# Patient Record
Sex: Male | Born: 1990
Health system: Southern US, Community
[De-identification: ages and names within clinical notes are randomized; demographics above are authoritative.]

## PROBLEM LIST (undated history)

## (undated) DIAGNOSIS — F1290 Cannabis use, unspecified, uncomplicated: Secondary | ICD-10-CM

## (undated) DIAGNOSIS — B20 Human immunodeficiency virus [HIV] disease: Secondary | ICD-10-CM

## (undated) DIAGNOSIS — Z21 Asymptomatic human immunodeficiency virus [HIV] infection status: Secondary | ICD-10-CM

## (undated) HISTORY — DX: Cannabis use, unspecified, uncomplicated: F12.90

## (undated) HISTORY — PX: OTHER SURGICAL HISTORY: SHX169

---
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
## 2008-04-26 ENCOUNTER — Emergency Department (HOSPITAL_COMMUNITY): Admission: EM | Admit: 2008-04-26 | Discharge: 2008-04-26 | Payer: Self-pay | Admitting: Internal Medicine

## 2008-09-24 ENCOUNTER — Emergency Department (HOSPITAL_COMMUNITY): Admission: EM | Admit: 2008-09-24 | Discharge: 2008-09-24 | Payer: Self-pay | Admitting: Emergency Medicine

## 2008-09-26 ENCOUNTER — Emergency Department (HOSPITAL_COMMUNITY): Admission: EM | Admit: 2008-09-26 | Discharge: 2008-09-26 | Payer: Self-pay | Admitting: Emergency Medicine

## 2009-04-01 ENCOUNTER — Emergency Department: Payer: Self-pay | Admitting: Emergency Medicine

## 2010-05-19 LAB — COMPREHENSIVE METABOLIC PANEL
ALT: 11 U/L (ref 0–53)
AST: 17 U/L (ref 0–37)
Albumin: 3.9 g/dL (ref 3.5–5.2)
Alkaline Phosphatase: 67 U/L (ref 39–117)
BUN: 8 mg/dL (ref 6–23)
CO2: 28 mEq/L (ref 19–32)
Calcium: 9.2 mg/dL (ref 8.4–10.5)
Chloride: 101 mEq/L (ref 96–112)
Creatinine, Ser: 0.87 mg/dL (ref 0.4–1.5)
GFR calc Af Amer: >60 mL/min (ref >60)
GFR calc non Af Amer: >60 mL/min (ref >60)
Glucose, Bld: 98 mg/dL (ref 70–99)
Potassium: 3.7 mEq/L (ref 3.5–5.1)
Sodium: 135 mEq/L (ref 135–145)
Total Bilirubin: 0.4 mg/dL (ref 0.3–1.2)
Total Protein: 6.8 g/dL (ref 6.0–8.3)

## 2010-05-19 LAB — CBC
HCT: 45.6 % (ref 39.0–52.0)
Hemoglobin: 15.4 g/dL (ref 13.0–17.0)
MCHC: 33.9 g/dL (ref 30.0–36.0)
MCV: 89.6 fL (ref 78.0–100.0)
Platelets: 270 10*3/uL (ref 150–400)
RBC: 5.1 MIL/uL (ref 4.22–5.81)
RDW: 13.7 % (ref 11.5–15.5)
WBC: 10 10*3/uL (ref 4.0–10.5)

## 2010-05-19 LAB — URINALYSIS, ROUTINE W REFLEX MICROSCOPIC
Bilirubin Urine: NEGATIVE
Glucose, UA: NEGATIVE mg/dL
Hgb urine dipstick: NEGATIVE
Ketones, ur: NEGATIVE mg/dL
Nitrite: NEGATIVE
Protein, ur: NEGATIVE mg/dL
Specific Gravity, Urine: 1.026 (ref 1.005–1.030)
Urobilinogen, UA: 1 mg/dL (ref 0.0–1.0)
pH: 6 (ref 5.0–8.0)

## 2010-05-19 LAB — DIFFERENTIAL
Basophils Absolute: 0.1 10*3/uL (ref 0.0–0.1)
Basophils Relative: 1 % (ref 0–1)
Eosinophils Absolute: 0.1 10*3/uL (ref 0.0–0.7)
Eosinophils Relative: 1 % (ref 0–5)
Lymphocytes Relative: 26 % (ref 12–46)
Lymphs Abs: 2.6 10*3/uL (ref 0.7–4.0)
Monocytes Absolute: 0.9 10*3/uL (ref 0.1–1.0)
Monocytes Relative: 9 % (ref 3–12)
Neutro Abs: 6.2 10*3/uL (ref 1.7–7.7)
Neutrophils Relative %: 62 % (ref 43–77)

## 2010-05-19 LAB — LIPASE, BLOOD: Lipase: 16 U/L (ref 11–59)

## 2010-07-02 ENCOUNTER — Emergency Department: Payer: Self-pay | Admitting: Emergency Medicine

## 2010-09-21 DIAGNOSIS — B20 Human immunodeficiency virus [HIV] disease: Secondary | ICD-10-CM | POA: Insufficient documentation

## 2010-10-02 ENCOUNTER — Ambulatory Visit: Payer: Self-pay

## 2010-12-31 DIAGNOSIS — Z720 Tobacco use: Secondary | ICD-10-CM | POA: Insufficient documentation

## 2010-12-31 DIAGNOSIS — F17200 Nicotine dependence, unspecified, uncomplicated: Secondary | ICD-10-CM | POA: Insufficient documentation

## 2011-03-25 DIAGNOSIS — K6282 Dysplasia of anus: Secondary | ICD-10-CM | POA: Insufficient documentation

## 2011-04-24 ENCOUNTER — Encounter: Payer: Self-pay | Admitting: *Deleted

## 2011-04-24 NOTE — Progress Notes (Signed)
Referral for this patient made and no contact was ever established. (see Tammy's file) Will forward the file to [facility] for follow up.

## 2011-04-26 ENCOUNTER — Encounter: Payer: Self-pay | Admitting: *Deleted

## 2011-04-26 NOTE — Progress Notes (Signed)
This patient has moved out of our service area and receives his care at [facility].

## 2011-07-20 DIAGNOSIS — Z1589 Genetic susceptibility to other disease: Secondary | ICD-10-CM | POA: Insufficient documentation

## 2012-12-17 ENCOUNTER — Emergency Department (HOSPITAL_COMMUNITY)
Admission: EM | Admit: 2012-12-17 | Discharge: 2012-12-17 | Disposition: A | Discharge status: Home / Self Care | Payer: Self-pay | Attending: Emergency Medicine | Admitting: Emergency Medicine

## 2012-12-17 ENCOUNTER — Encounter (HOSPITAL_COMMUNITY): Payer: Self-pay | Admitting: Emergency Medicine

## 2012-12-17 DIAGNOSIS — R112 Nausea with vomiting, unspecified: Secondary | ICD-10-CM | POA: Insufficient documentation

## 2012-12-17 DIAGNOSIS — R197 Diarrhea, unspecified: Secondary | ICD-10-CM | POA: Insufficient documentation

## 2012-12-17 DIAGNOSIS — Z79899 Other long term (current) drug therapy: Secondary | ICD-10-CM | POA: Insufficient documentation

## 2012-12-17 DIAGNOSIS — Z21 Asymptomatic human immunodeficiency virus [HIV] infection status: Secondary | ICD-10-CM | POA: Insufficient documentation

## 2012-12-17 DIAGNOSIS — F17200 Nicotine dependence, unspecified, uncomplicated: Secondary | ICD-10-CM | POA: Insufficient documentation

## 2012-12-17 HISTORY — DX: Asymptomatic human immunodeficiency virus (hiv) infection status: Z21

## 2012-12-17 HISTORY — DX: Human immunodeficiency virus (HIV) disease: B20

## 2012-12-17 LAB — CBC WITH DIFFERENTIAL/PLATELET
Basophils Absolute: 0.1 10*3/uL (ref 0.0–0.1)
Lymphocytes Relative: 15 % (ref 12–46)
Monocytes Relative: 15 % — ABNORMAL HIGH (ref 3–12)
Neutrophils Relative %: 69 % (ref 43–77)
Platelets: 204 10*3/uL (ref 150–400)
RDW: 13.5 % (ref 11.5–15.5)
WBC: 9.2 10*3/uL (ref 4.0–10.5)

## 2012-12-17 LAB — COMPREHENSIVE METABOLIC PANEL
Alkaline Phosphatase: 97 U/L (ref 39–117)
BUN: 14 mg/dL (ref 6–23)
Chloride: 97 mEq/L (ref 96–112)
GFR calc Af Amer: >90 mL/min (ref >90)
Glucose, Bld: 99 mg/dL (ref 70–99)
Potassium: 3.7 mEq/L (ref 3.5–5.1)
Total Bilirubin: 0.2 mg/dL — ABNORMAL LOW (ref 0.3–1.2)
Total Protein: 7.3 g/dL (ref 6.0–8.3)

## 2012-12-17 MED ORDER — MORPHINE SULFATE 4 MG/ML IJ SOLN
4.0000 mg | Freq: Once | INTRAMUSCULAR | Status: AC
  Administered 2012-12-17: 4 mg via INTRAVENOUS
  Filled 2012-12-17: qty 1

## 2012-12-17 MED ORDER — SODIUM CHLORIDE 0.9 % IV BOLUS (SEPSIS)
2000.0000 mL | Freq: Once | INTRAVENOUS | Status: AC
  Administered 2012-12-17: 2000 mL via INTRAVENOUS

## 2012-12-17 MED ORDER — ONDANSETRON HCL 4 MG/2ML IJ SOLN
4.0000 mg | Freq: Once | INTRAMUSCULAR | Status: AC
  Administered 2012-12-17: 4 mg via INTRAVENOUS
  Filled 2012-12-17: qty 2

## 2012-12-17 MED ORDER — ONDANSETRON HCL 8 MG PO TABS: 8.0000 mg | ORAL_TABLET | Freq: Three times a day (TID) | ORAL | Status: DC | PRN

## 2012-12-17 NOTE — ED Notes (Signed)
Pt presents with mid umbilical abdominal pain and n/v x 2 days. Pain worsening today. Pt is ashen in color.  And states does not feel well. Last normal BM was this morning.  vomiting with food only can keep liquids down per pt.

## 2012-12-17 NOTE — ED Provider Notes (Signed)
CSN: [id number]     Arrival date & time 12/17/12  1135 History  This chart was scribed for Doug Sou, MD,  by Ashley Jacobs, ED Scribe. The patient was seen in room APA09/APA09 and the patient's care was started at 2:31 PM   First MD Initiated Contact with Patient 12/17/12 1425     Chief Complaint  Patient presents with  . Abdominal Pain   (Consider location/radiation/quality/duration/timing/severity/associated sxs/prior Treatment) The history is provided by the patient and medical records. No language interpreter was used.   HPI Comments: Scott Butler is a 22 y.o. male who presents to the Emergency Department complaining of intermittent, moderate, upper abdominal pain that presented 3 days ago. He states he ate something and the pain returned yesterday. Pt reports the associated symptoms of vomiting ( most recent episode was last night), diarrhea, and decreased appetite (his last meal of a couple of sandwiches was last night). He explains that the pain is worse mostly with drinking and eating. The pain is relieved with laying on his stomach. His last BM was just before arrival to the ED, diarrhea, no blood. Able to drink water without pain Pt denies fever and  blood in stool. His last CD-4 count was in the 800's 2 months ago.  Pt has a medical hx of HIV. Pt does not have any known allergies. His past surgeries consists of anal wart removal. He is a current smoker and drinks alcohol. No treatment prior to coming here. Pain mild to moderate at present. Past Medical History  Diagnosis Date  . HIV (human immunodeficiency virus infection)    Past Surgical History  Procedure Laterality Date  . Removal of anal warts     History reviewed. No pertinent family history. History  Substance Use Topics  . Smoking status: Current Every Day Smoker  . Smokeless tobacco: Not on file  . Alcohol Use: No    Review of Systems  Constitutional: Positive for appetite change. Negative for fatigue.   HENT: Negative.   Respiratory: Negative.   Cardiovascular: Negative.   Gastrointestinal: Positive for nausea, vomiting, abdominal pain and diarrhea. Negative for blood in stool.  Musculoskeletal: Negative.   Skin: Negative.   Neurological: Negative.   Psychiatric/Behavioral: Negative.   All other systems reviewed and are negative.    Allergies  Review of patient's allergies indicates no known allergies.  Home Medications   Current Outpatient Rx  Name  Route  Sig  Dispense  Refill  . efavirenz (SUSTIVA) 600 MG tablet   Oral   Take 600 mg by mouth at bedtime.         Marland Kitchen emtricitabine-tenofovir (TRUVADA) 200-300 MG per tablet   Oral   Take 1 tablet by mouth daily.          BP 106/53  Pulse 77  Temp(Src) 98.2 F (36.8 C) (Oral)  Resp 14  Ht 5\' 7"  (1.702 m)  Wt 135 lb (61.236 kg)  BMI 21.14 kg/m2  SpO2 98% Physical Exam  Nursing note and vitals reviewed. Constitutional: He is oriented to person, place, and time. He appears well-developed and well-nourished. No distress.  HENT:  Head: Normocephalic.  Mucous membranes dry  Eyes: Conjunctivae are normal. Pupils are equal, round, and reactive to light.  Neck: Neck supple. No thyromegaly present.  Cardiovascular: Normal rate, regular rhythm and normal heart sounds.   Pulmonary/Chest: Effort normal and breath sounds normal.  Abdominal: Soft. Bowel sounds are normal. There is tenderness.  Tender at epigastrium  Genitourinary: Penis  normal.  Normal circumcised male  Musculoskeletal: Normal range of motion. He exhibits no edema and no tenderness.  Neurological: He is alert and oriented to person, place, and time. No cranial nerve deficit.  Skin: Skin is warm.  Psychiatric: His behavior is normal.    ED Course  Procedures (including critical care time) DIAGNOSTIC STUDIES: Oxygen Saturation is 98% on room air, normal by my interpretation.    COORDINATION OF CARE: 2:36 PM Discussed course of care with pt . Pt  understands and agrees.  Labs Review Labs Reviewed  COMPREHENSIVE METABOLIC PANEL - Abnormal; Notable for the following:    Sodium 133 (*)    AST 59 (*)    Total Bilirubin 0.2 (*)    All other components within normal limits  CBC WITH DIFFERENTIAL - Abnormal; Notable for the following:    Monocytes Relative 15 (*)    Monocytes Absolute 1.4 (*)    All other components within normal limits   Imaging Review No results found.  EKG Interpretation   None      5:05 PM patient feels much improved after treatment with intravenous fluids, opioids and antiemetics. He is able to drink water without difficulty. He feels ready for discharge. Results for orders placed during the hospital encounter of 12/17/12  COMPREHENSIVE METABOLIC PANEL      Result Value Range   Sodium 133 (*) 135 - 145 mEq/L   Potassium 3.7  3.5 - 5.1 mEq/L   Chloride 97  96 - 112 mEq/L   CO2 25  19 - 32 mEq/L   Glucose, Bld 99  70 - 99 mg/dL   BUN 14  6 - 23 mg/dL   Creatinine, Ser 1.61  0.50 - 1.35 mg/dL   Calcium 9.1  8.4 - 09.6 mg/dL   Total Protein 7.3  6.0 - 8.3 g/dL   Albumin 3.9  3.5 - 5.2 g/dL   AST 59 (*) 0 - 37 U/L   ALT 27  0 - 53 U/L   Alkaline Phosphatase 97  39 - 117 U/L   Total Bilirubin 0.2 (*) 0.3 - 1.2 mg/dL   GFR calc non Af Amer >90  >90 mL/min   GFR calc Af Amer >90  >90 mL/min  CBC WITH DIFFERENTIAL      Result Value Range   WBC 9.2  4.0 - 10.5 K/uL   RBC 5.35  4.22 - 5.81 MIL/uL   Hemoglobin 16.4  13.0 - 17.0 g/dL   HCT 04.5  40.9 - 81.1 %   MCV 89.0  78.0 - 100.0 fL   MCH 30.7  26.0 - 34.0 pg   MCHC 34.5  30.0 - 36.0 g/dL   RDW 91.4  78.2 - 95.6 %   Platelets 204  150 - 400 K/uL   Neutrophils Relative % 69  43 - 77 %   Lymphocytes Relative 15  12 - 46 %   Monocytes Relative 15 (*) 3 - 12 %   Eosinophils Relative 0  0 - 5 %   Basophils Relative 1  0 - 1 %   Neutro Abs 6.3  1.7 - 7.7 K/uL   Lymphs Abs 1.4  0.7 - 4.0 K/uL   Monocytes Absolute 1.4 (*) 0.1 - 1.0 K/uL   Eosinophils  Absolute 0.0  0.0 - 0.7 K/uL   Basophils Absolute 0.1  0.0 - 0.1 K/uL   WBC Morphology ATYPICAL LYMPHOCYTES     Smear Review LARGE PLATELETS PRESENT     No results  found.  MDM  No diagnosis found. Suspect gastroenteritis. Plan prescription Zofran, Imodium followup with PMD if significant symptoms in 2 or 3 days. Avoid milk and dairy. Dx #1 abdominal pain #2 nausea vomiting diarrhea      Doug Sou, MD 12/17/12 1714

## 2012-12-17 NOTE — ED Notes (Signed)
Onset Monday with NVD  And abd pain.  ? Fever , chills.

## 2013-12-02 ENCOUNTER — Encounter (HOSPITAL_COMMUNITY): Payer: Self-pay | Admitting: Emergency Medicine

## 2013-12-02 ENCOUNTER — Emergency Department (HOSPITAL_COMMUNITY)
Admission: EM | Admit: 2013-12-02 | Discharge: 2013-12-03 | Disposition: A | Discharge status: Other Acute Inpatient Hospital | Payer: Self-pay | Attending: Emergency Medicine | Admitting: Emergency Medicine

## 2013-12-02 ENCOUNTER — Emergency Department (HOSPITAL_COMMUNITY): Payer: Self-pay

## 2013-12-02 DIAGNOSIS — Z21 Asymptomatic human immunodeficiency virus [HIV] infection status: Secondary | ICD-10-CM | POA: Insufficient documentation

## 2013-12-02 DIAGNOSIS — Z79899 Other long term (current) drug therapy: Secondary | ICD-10-CM | POA: Insufficient documentation

## 2013-12-02 DIAGNOSIS — K611 Rectal abscess: Secondary | ICD-10-CM | POA: Insufficient documentation

## 2013-12-02 DIAGNOSIS — Z720 Tobacco use: Secondary | ICD-10-CM | POA: Insufficient documentation

## 2013-12-02 LAB — CBC WITH DIFFERENTIAL/PLATELET
BASOS ABS: 0 10*3/uL (ref 0.0–0.1)
BASOS PCT: 0 % (ref 0–1)
EOS PCT: 1 % (ref 0–5)
Eosinophils Absolute: 0.1 10*3/uL (ref 0.0–0.7)
HEMATOCRIT: 43 % (ref 39.0–52.0)
HEMOGLOBIN: 15 g/dL (ref 13.0–17.0)
Lymphocytes Relative: 20 % (ref 12–46)
Lymphs Abs: 2.3 10*3/uL (ref 0.7–4.0)
MCH: 30.9 pg (ref 26.0–34.0)
MCHC: 34.9 g/dL (ref 30.0–36.0)
MCV: 88.5 fL (ref 78.0–100.0)
MONO ABS: 1.3 10*3/uL — AB (ref 0.1–1.0)
MONOS PCT: 11 % (ref 3–12)
NEUTROS ABS: 8.1 10*3/uL — AB (ref 1.7–7.7)
Neutrophils Relative %: 68 % (ref 43–77)
Platelets: 361 10*3/uL (ref 150–400)
RBC: 4.86 MIL/uL (ref 4.22–5.81)
RDW: 13.2 % (ref 11.5–15.5)
WBC: 11.8 10*3/uL — ABNORMAL HIGH (ref 4.0–10.5)

## 2013-12-02 LAB — COMPREHENSIVE METABOLIC PANEL
ALBUMIN: 3.9 g/dL (ref 3.5–5.2)
ALT: 12 U/L (ref 0–53)
ANION GAP: 12 (ref 5–15)
AST: 15 U/L (ref 0–37)
Alkaline Phosphatase: 89 U/L (ref 39–117)
BUN: 10 mg/dL (ref 6–23)
CALCIUM: 9.5 mg/dL (ref 8.4–10.5)
CHLORIDE: 99 meq/L (ref 96–112)
CO2: 26 meq/L (ref 19–32)
CREATININE: 0.86 mg/dL (ref 0.50–1.35)
GFR calc Af Amer: >90 mL/min (ref >90)
Glucose, Bld: 104 mg/dL — ABNORMAL HIGH (ref 70–99)
Potassium: 4.3 mEq/L (ref 3.7–5.3)
Sodium: 137 mEq/L (ref 137–147)
Total Protein: 8.1 g/dL (ref 6.0–8.3)

## 2013-12-02 MED ORDER — SODIUM CHLORIDE 0.9 % IV BOLUS (SEPSIS)
1000.0000 mL | Freq: Once | INTRAVENOUS | Status: AC
  Administered 2013-12-02: 1000 mL via INTRAVENOUS

## 2013-12-02 MED ORDER — VANCOMYCIN HCL 10 G IV SOLR
1250.0000 mg | Freq: Once | INTRAVENOUS | Status: AC
  Administered 2013-12-02: 1250 mg via INTRAVENOUS
  Filled 2013-12-02: qty 1250

## 2013-12-02 MED ORDER — HYDROCODONE-ACETAMINOPHEN 5-325 MG PO TABS
2.0000 | ORAL_TABLET | Freq: Once | ORAL | Status: DC
  Filled 2013-12-02: qty 2

## 2013-12-02 MED ORDER — HYDROMORPHONE HCL 1 MG/ML IJ SOLN
1.0000 mg | Freq: Once | INTRAMUSCULAR | Status: AC
  Administered 2013-12-02: 1 mg via INTRAVENOUS
  Filled 2013-12-02: qty 1

## 2013-12-02 MED ORDER — MORPHINE SULFATE 4 MG/ML IJ SOLN
4.0000 mg | Freq: Once | INTRAMUSCULAR | Status: AC
  Administered 2013-12-02: 4 mg via INTRAVENOUS
  Filled 2013-12-02: qty 1

## 2013-12-02 MED ORDER — IOHEXOL 300 MG/ML  SOLN: 80.0000 mL | Freq: Once | INTRAMUSCULAR | Status: AC | PRN

## 2013-12-02 MED ORDER — MORPHINE SULFATE 2 MG/ML IJ SOLN
2.0000 mg | Freq: Once | INTRAMUSCULAR | Status: AC
  Administered 2013-12-02: 2 mg via INTRAVENOUS
  Filled 2013-12-02: qty 1

## 2013-12-02 MED ORDER — FENTANYL CITRATE 0.05 MG/ML IJ SOLN
50.0000 ug | Freq: Once | INTRAMUSCULAR | Status: AC
  Administered 2013-12-02: 50 ug via INTRAVENOUS
  Filled 2013-12-02: qty 2

## 2013-12-02 MED ORDER — MORPHINE SULFATE 2 MG/ML IJ SOLN: 2.0000 mg | Freq: Once | INTRAMUSCULAR | Status: DC

## 2013-12-02 NOTE — ED Notes (Signed)
Pt c/o rectal pain and drainage after having anal wart removal last Friday

## 2013-12-02 NOTE — ED Provider Notes (Signed)
The patient is a 23 year old HIV-positive male who recently had anal warts removed surgically, presents within the week for this procedure with redness swelling and drainage from his left perianal area. On exam the patient has a significantly swollen red tender and spontaneously draining lesion, there is purulent necrotic material in this area. Rectal exam deferred. Abdominal exam benign, CT scan ordered to rule out perirectal proximally a sitting abscess. Antibiotics were ordered.  The CT scan show a large abscess in the perirectal area that is very concerning given the patients immunocompromised status.  He has been given antibioitcs and surgery was consulted.  Dr. Janee Mornhompson recommends that the pt be transferred to the hospital [facility] - the care was d/w the on call surgeon who accepted the pt in transfer pending bed availability.  Pt's VS were stable throughout.  I saw and evaluated the patient, reviewed the resident's note and I agree with the findings and plan.  Final diagnoses:  Abscess, perirectal      Vida RollerBrian D Mardelle Pandolfi, MD 12/03/13 [phone]

## 2013-12-02 NOTE — ED Notes (Signed)
Spoke with Reuel Boomaniel from [facility], no bed assignment for pt at transfering facility yet. Will call when we have an assigned room.   [phone] Transfer Services Phone Number

## 2013-12-02 NOTE — ED Notes (Signed)
Pts friend to nurse first desk stating patients bleeding is worsening. Pt with pale, lethargic appearance, lying in waiting room. Upgraded patient acuity to 2.

## 2013-12-02 NOTE — ED Notes (Addendum)
Pt states pain is now 4/10. Antibiotics have been started. Will continue to monitor. Pt is ready for CT

## 2013-12-02 NOTE — Consult Note (Signed)
Reason for Consult:Postoperative infection of the perianal area Referring Physician: Noemi Butler  Scott Butler is an 23 y.o. male.  HPI: Scott Butler Underwent excision of condylomata at [facility] by Dr.Reva Butler one week ago. Last night, he developed purulent drainage from his wound and increased pain. He had difficulty contacting Dr.Rahbar's office so he came to the emergency department here.  Past Medical History  Diagnosis Date  . HIV (human immunodeficiency virus infection)     Past Surgical History  Procedure Laterality Date  . Removal of anal warts      History reviewed. No pertinent family history.  Social History:  reports that he has been smoking.  He does not have any smokeless tobacco history on file. He reports that he uses illicit drugs (Marijuana). He reports that he does not drink alcohol.  Allergies: No Known Allergies  Medications: Prior to Admission:  (Not in a hospital admission)  Results for orders placed during the hospital encounter of 12/02/13 (from the past 48 hour(s))  CBC WITH DIFFERENTIAL     Status: Abnormal   Collection Time    12/02/13  1:48 PM      Result Value Ref Range   WBC 11.8 (*) 4.0 - 10.5 K/uL   RBC 4.86  4.22 - 5.81 MIL/uL   Hemoglobin 15.0  13.0 - 17.0 g/dL   HCT 43.0  39.0 - 52.0 %   MCV 88.5  78.0 - 100.0 fL   MCH 30.9  26.0 - 34.0 pg   MCHC 34.9  30.0 - 36.0 g/dL   RDW 13.2  11.5 - 15.5 %   Platelets 361  150 - 400 K/uL   Neutrophils Relative % 68  43 - 77 %   Neutro Abs 8.1 (*) 1.7 - 7.7 K/uL   Lymphocytes Relative 20  12 - 46 %   Lymphs Abs 2.3  0.7 - 4.0 K/uL   Monocytes Relative 11  3 - 12 %   Monocytes Absolute 1.3 (*) 0.1 - 1.0 K/uL   Eosinophils Relative 1  0 - 5 %   Eosinophils Absolute 0.1  0.0 - 0.7 K/uL   Basophils Relative 0  0 - 1 %   Basophils Absolute 0.0  0.0 - 0.1 K/uL  COMPREHENSIVE METABOLIC PANEL     Status: Abnormal   Collection Time    12/02/13  1:48 PM      Result Value Ref Range   Sodium 137  137 -  147 mEq/L   Potassium 4.3  3.7 - 5.3 mEq/L   Chloride 99  96 - 112 mEq/L   CO2 26  19 - 32 mEq/L   Glucose, Bld 104 (*) 70 - 99 mg/dL   BUN 10  6 - 23 mg/dL   Creatinine, Ser 0.86  0.50 - 1.35 mg/dL   Calcium 9.5  8.4 - 10.5 mg/dL   Total Protein 8.1  6.0 - 8.3 g/dL   Albumin 3.9  3.5 - 5.2 g/dL   AST 15  0 - 37 U/L   ALT 12  0 - 53 U/L   Alkaline Phosphatase 89  39 - 117 U/L   Total Bilirubin <0.2 (*) 0.3 - 1.2 mg/dL   GFR calc non Af Amer >90  >90 mL/min   GFR calc Af Amer >90  >90 mL/min   Comment: (NOTE)     The eGFR has been calculated using the CKD EPI equation.     This calculation has not been validated in all clinical situations.  eGFR's persistently <90 mL/min signify possible Chronic Kidney     Disease.   Anion gap 12  5 - 15    Ct Pelvis W Contrast  12/02/2013   CLINICAL DATA:  Perirectal abscess with drainage.  EXAM: CT PELVIS WITH CONTRAST  TECHNIQUE: Multidetector CT imaging of the pelvis was performed using the standard protocol following the bolus administration of intravenous contrast.  CONTRAST:  80 cc Omnipaque 300.  COMPARISON:  09/26/2008.  FINDINGS: Low attenuation fluid and gas surround the rectum, measuring approximately 3.6 x 5.2 cm (series 3, image 80). There is a communication to the skin surface along the 6 o'clock margin (series 3, image 87). Increased vascularity is seen within the perirectal fat and pelvis. Largest perirectal lymph node measures 9 mm. Small amount of dependent fluid is seen in the pelvis. Visualized portions of the bowel are otherwise grossly unremarkable. Additional lymph nodes are seen along the iliac chains, obturator stations and inguinal regions. The largest lymph node is seen in the left external iliac station, measuring 1.4 cm (image 54). Bladder is grossly unremarkable.  No worrisome lytic or sclerotic lesions.  IMPRESSION: 1. Perirectal abscess with a cutaneous fistula. 2. Likely reactive perirectal lymph nodes. 3. Adenopathy  along the iliac chains and inguinal regions bilaterally, difficult to exclude a lymphoproliferative disorder. 4. Small amount of pelvic free fluid.   Electronically Signed   By: Lorin Picket M.D.   On: 12/02/2013 19:19    Review of Systems  Unable to perform ROS: other   Blood pressure 106/55, pulse 72, temperature 98.5 F (36.9 C), temperature source Oral, resp. rate 18, SpO2 99.00%. Physical Exam  Constitutional: He is oriented to person, place, and time. He appears well-developed and well-nourished. No distress.  Cardiovascular: Normal rate and normal heart sounds.   Respiratory: Effort normal and breath sounds normal.  GI: Soft. He exhibits no distension. There is no tenderness.  Genitourinary:  Multiple areas of recent excision of condylomata, central area with gray coloration of the skin and purulent drainage and some induration, rectal exam deferred due to pain  Neurological: He is alert and oriented to person, place, and time.  Skin: Skin is warm.    Assessment/Plan: Status post excision of multiple condylomata with postoperative wound infection. Patient needs to be cared for by his operating surgeon. Recommend transfer to [facility], IV antibiotics, and possible drainage.  Scott Butler E 12/02/2013, 8:18 PM

## 2013-12-02 NOTE — ED Notes (Addendum)
Pt had laser wart removal surgery last Friday. Pt noticed area began swelling on Sunday. Over the week the "bump" grew bigger and bigger and busted yesterday. The area is very red on the left side of the anus and warts are draining purulent fluid.

## 2013-12-02 NOTE — ED Provider Notes (Signed)
CSN: [id number]     Arrival date & time 12/02/13  1306 History   First MD Initiated Contact with Patient 12/02/13 1546     Chief Complaint  Patient presents with  . Rectal Pain    Patient is a 23 y.o. male presenting with hematochezia. The history is provided by the patient and a friend.  Rectal Bleeding Quality:  Mucoid (brown and red) Amount:  Moderate Duration:  3 days Timing:  Constant Progression:  Worsening Chronicity:  New Context: rectal pain   Context comment:  Anal wart removed 6 days ago Pain details:    Quality:  Burning and aching   Severity:  Severe   Duration:  2 days   Timing:  Constant   Progression:  Worsening Similar prior episodes: no   Relieved by:  None tried Worsened by:  Defecation and wiping Ineffective treatments:  None tried Associated symptoms: fever   Associated symptoms: no abdominal pain, no dizziness, no light-headedness and no vomiting   Fever:    Duration:  6 days   Timing:  Intermittent   Temp source:  Subjective   Progression:  Waxing and waning Risk factors comment:  Immunosuppressed   Past Medical History  Diagnosis Date  . HIV (human immunodeficiency virus infection)    Past Surgical History  Procedure Laterality Date  . Removal of anal warts     History reviewed. No pertinent family history. History  Substance Use Topics  . Smoking status: Current Every Day Smoker  . Smokeless tobacco: Not on file  . Alcohol Use: No    Review of Systems  Constitutional: Positive for fever and chills.  Respiratory: Negative for shortness of breath.   Cardiovascular: Negative for chest pain.  Gastrointestinal: Positive for hematochezia. Negative for vomiting and abdominal pain.  Musculoskeletal: Positive for gait problem (secondary to pain).  Skin: Positive for wound (left rectal ).  Neurological: Negative for dizziness and light-headedness.  All other systems reviewed and are negative.     Allergies  Review of patient's  allergies indicates no known allergies.  Home Medications   Prior to Admission medications   Medication Sig Start Date End Date Taking? Authorizing Provider  efavirenz (SUSTIVA) 600 MG tablet Take 600 mg by mouth at bedtime.    Historical Provider, MD  emtricitabine-tenofovir (TRUVADA) 200-300 MG per tablet Take 1 tablet by mouth daily.    Historical Provider, MD  ondansetron (ZOFRAN) 8 MG tablet Take 1 tablet (8 mg total) by mouth every 8 (eight) hours as needed for nausea. 12/17/12   Doug Sou, MD   BP 102/69  Pulse 81  Temp(Src) 98.5 F (36.9 C) (Oral)  Resp 18  SpO2 97%  Physical Exam  Nursing note and vitals reviewed. Constitutional: He appears well-developed and well-nourished. He is cooperative.  Non-toxic appearance. He does not have a sickly appearance. He does not appear ill. No distress.  HENT:  Head: Normocephalic and atraumatic.  Right Ear: External ear normal.  Left Ear: External ear normal.  Nose: Nose normal.  Mouth/Throat: Uvula is midline and oropharynx is clear and moist.  Neck: Normal range of motion and phonation normal.  Cardiovascular: Normal rate and regular rhythm.   Pulmonary/Chest: Effort normal and breath sounds normal.  Abdominal: Soft. He exhibits no distension. There is no tenderness. There is no rebound and no guarding.  Genitourinary: Rectal exam shows tenderness.  Chaperone present; left side of rectum erythematous, spreading onto the gluteal region, brown area with purulent discharge, foul smelling  Neurological: He  is alert.  Skin: Skin is warm and dry. No rash noted. He is not diaphoretic. No pallor.  Psychiatric: He has a normal mood and affect. His speech is normal.    ED Course  Procedures (including critical care time) Labs Review  Results for orders placed during the hospital encounter of 12/02/13  CBC WITH DIFFERENTIAL      Result Value Ref Range   WBC 11.8 (*) 4.0 - 10.5 K/uL   RBC 4.86  4.22 - 5.81 MIL/uL   Hemoglobin 15.0   13.0 - 17.0 g/dL   HCT 32.443.0  40.139.0 - 02.752.0 %   MCV 88.5  78.0 - 100.0 fL   MCH 30.9  26.0 - 34.0 pg   MCHC 34.9  30.0 - 36.0 g/dL   RDW 25.313.2  66.411.5 - 40.315.5 %   Platelets 361  150 - 400 K/uL   Neutrophils Relative % 68  43 - 77 %   Neutro Abs 8.1 (*) 1.7 - 7.7 K/uL   Lymphocytes Relative 20  12 - 46 %   Lymphs Abs 2.3  0.7 - 4.0 K/uL   Monocytes Relative 11  3 - 12 %   Monocytes Absolute 1.3 (*) 0.1 - 1.0 K/uL   Eosinophils Relative 1  0 - 5 %   Eosinophils Absolute 0.1  0.0 - 0.7 K/uL   Basophils Relative 0  0 - 1 %   Basophils Absolute 0.0  0.0 - 0.1 K/uL  COMPREHENSIVE METABOLIC PANEL      Result Value Ref Range   Sodium 137  137 - 147 mEq/L   Potassium 4.3  3.7 - 5.3 mEq/L   Chloride 99  96 - 112 mEq/L   CO2 26  19 - 32 mEq/L   Glucose, Bld 104 (*) 70 - 99 mg/dL   BUN 10  6 - 23 mg/dL   Creatinine, Ser 4.740.86  0.50 - 1.35 mg/dL   Calcium 9.5  8.4 - 25.910.5 mg/dL   Total Protein 8.1  6.0 - 8.3 g/dL   Albumin 3.9  3.5 - 5.2 g/dL   AST 15  0 - 37 U/L   ALT 12  0 - 53 U/L   Alkaline Phosphatase 89  39 - 117 U/L   Total Bilirubin <0.2 (*) 0.3 - 1.2 mg/dL   GFR calc non Af Amer >90  >90 mL/min   GFR calc Af Amer >90  >90 mL/min   Anion gap 12  5 - 15    Imaging Review Ct Pelvis W Contrast  12/02/2013   CLINICAL DATA:  Perirectal abscess with drainage.  EXAM: CT PELVIS WITH CONTRAST  TECHNIQUE: Multidetector CT imaging of the pelvis was performed using the standard protocol following the bolus administration of intravenous contrast.  CONTRAST:  80 cc Omnipaque 300.  COMPARISON:  09/26/2008.  FINDINGS: Low attenuation fluid and gas surround the rectum, measuring approximately 3.6 x 5.2 cm (series 3, image 80). There is a communication to the skin surface along the 6 o'clock margin (series 3, image 87). Increased vascularity is seen within the perirectal fat and pelvis. Largest perirectal lymph node measures 9 mm. Small amount of dependent fluid is seen in the pelvis. Visualized portions  of the bowel are otherwise grossly unremarkable. Additional lymph nodes are seen along the iliac chains, obturator stations and inguinal regions. The largest lymph node is seen in the left external iliac station, measuring 1.4 cm (image 54). Bladder is grossly unremarkable.  No worrisome lytic or sclerotic lesions.  IMPRESSION: 1. Perirectal abscess with a cutaneous fistula. 2. Likely reactive perirectal lymph nodes. 3. Adenopathy along the iliac chains and inguinal regions bilaterally, difficult to exclude a lymphoproliferative disorder. 4. Small amount of pelvic free fluid.   Electronically Signed   By: Leanna BattlesMelinda  Blietz M.D.   On: 12/02/2013 19:19    MDM   Final diagnoses:  Abscess, perirectal    Scott Butler is a 23 y.o. male with a history of HIV on medication. Reports recent viral load is undetectable. Presents 6 days after [facility] Dr. Romero Bellingeza Rahbar. Has had subjective fever/chills for the last 5 days. Yesterday had significantly increased pain at the site and began having purulent foul smelling discharge from the site. Erythema spreading across left buttock.   Exam as above. Concern for infection. Gave IV Vancomycin. Gave IV normal saline. Labs obtained. Mild leukocytosis, normal hemoglobin, normal platelet count. CMP unremarkable. CT Pelvis showed a perirectal abscess with cutaneous fistula. General surgery consulted. They saw and evaluated the patient. Deemed this to be a post-op infection, directly related to the recent anal wart removal 6 days ago.   [facility] contacted who accepted the patient in transfer for continuity of care. Accepting physician Dr. Roland RackPreston Rich. Sending physician Dr. Eber HongBrian Miller.   Filed Vitals:   12/02/13 1930 12/02/13 1945 12/02/13 2000 12/02/13 2044  BP: 112/63 90/67 108/74 118/67  Pulse: 72 78  67  Temp:      TempSrc:      Resp: 18 13  16   SpO2: 99% 100%  96%     The patient remained hemodynamically stable in the  ED.    Discussed and managed in conjunction with my attending, Dr. Eber HongBrian Miller.   Maxine GlennAnn Kathlyn Leachman, MD 8:57 PM 12/02/13 Emergency Medicine Resident, HO-II   Maxine GlennAnn Jordi Lacko, MD 12/02/13 [phone]

## 2013-12-03 MED ORDER — HYDROMORPHONE HCL 1 MG/ML IJ SOLN
1.0000 mg | Freq: Once | INTRAMUSCULAR | Status: AC
  Administered 2013-12-03: 1 mg via INTRAVENOUS
  Filled 2013-12-03: qty 1

## 2013-12-03 NOTE — ED Notes (Signed)
Pt very upset that he has not been [facility] yet. Pt aware we are [facility].

## 2013-12-03 NOTE — ED Notes (Signed)
Pt requesting pain medication, provider informed

## 2013-12-03 NOTE — ED Notes (Signed)
Pt requesting pain medication.  MD informed.

## 2013-12-03 NOTE — ED Notes (Signed)
ChiropodistAssistant Director called [facility] bed assignment, no bed assignment.

## 2013-12-03 NOTE — ED Notes (Signed)
Pt irate his [facility]. Pt informed of why this is against policy.

## 2013-12-03 NOTE — ED Provider Notes (Signed)
I saw and evaluated the patient, reviewed the resident's note and I agree with the findings and plan.  Please see my separate note regarding my evaluation of the patient.    Vida RollerBrian D Shareen Capwell, MD 12/03/13 [phone]

## 2013-12-03 NOTE — ED Notes (Signed)
Called [facility] regarding bed assignment, states unknown how long it will be before bed is available.

## 2013-12-03 NOTE — ED Notes (Signed)
Pt requesting pain medication, MD informed.

## 2013-12-03 NOTE — ED Notes (Signed)
Pt states that he is leaving AMA, states that [facility] and spoke with on call surgeon, who told him that he could leave here and go to [place] later and get appt and be admitted. Called [facility] again, states they do not have any beds. Pt discharged AMA.

## 2014-06-23 DIAGNOSIS — M419 Scoliosis, unspecified: Secondary | ICD-10-CM | POA: Insufficient documentation

## 2015-05-15 ENCOUNTER — Ambulatory Visit: Payer: Self-pay

## 2015-05-15 ENCOUNTER — Other Ambulatory Visit (INDEPENDENT_AMBULATORY_CARE_PROVIDER_SITE_OTHER): Payer: Self-pay

## 2015-05-15 DIAGNOSIS — B20 Human immunodeficiency virus [HIV] disease: Secondary | ICD-10-CM

## 2015-05-15 DIAGNOSIS — Z79899 Other long term (current) drug therapy: Secondary | ICD-10-CM

## 2015-05-15 DIAGNOSIS — Z113 Encounter for screening for infections with a predominantly sexual mode of transmission: Secondary | ICD-10-CM

## 2015-05-15 DIAGNOSIS — Z21 Asymptomatic human immunodeficiency virus [HIV] infection status: Secondary | ICD-10-CM

## 2015-05-15 LAB — LIPID PANEL
Cholesterol: 107 mg/dL — ABNORMAL LOW (ref 125–200)
HDL: 47 mg/dL (ref >=40)
LDL Cholesterol: 42 mg/dL (ref <130)
Total CHOL/HDL Ratio: 2.3 Ratio (ref <=5.0)
Triglycerides: 92 mg/dL (ref <150)
VLDL: 18 mg/dL (ref <30)

## 2015-05-15 LAB — HEPATITIS A ANTIBODY, TOTAL: Hep A Total Ab: REACTIVE — AB

## 2015-05-15 LAB — CBC WITH DIFFERENTIAL/PLATELET
BASOS ABS: 0 {cells}/uL (ref 0–200)
BASOS PCT: 0 %
EOS PCT: 1 %
Eosinophils Absolute: 77 cells/uL (ref 15–500)
HEMATOCRIT: 42.2 % (ref 38.5–50.0)
HEMOGLOBIN: 14.5 g/dL (ref 13.2–17.1)
LYMPHS PCT: 31 %
Lymphs Abs: 2387 cells/uL (ref 850–3900)
MCH: 30.8 pg (ref 27.0–33.0)
MCHC: 34.4 g/dL (ref 32.0–36.0)
MCV: 89.6 fL (ref 80.0–100.0)
MONOS PCT: 11 %
MPV: 9.8 fL (ref 7.5–12.5)
Monocytes Absolute: 847 cells/uL (ref 200–950)
NEUTROS ABS: 4389 {cells}/uL (ref 1500–7800)
Neutrophils Relative %: 57 %
Platelets: 292 10*3/uL (ref 140–400)
RBC: 4.71 MIL/uL (ref 4.20–5.80)
RDW: 13.1 % (ref 11.0–15.0)
WBC: 7.7 10*3/uL (ref 3.8–10.8)

## 2015-05-15 LAB — COMPLETE METABOLIC PANEL WITH GFR
ALBUMIN: 4.1 g/dL (ref 3.6–5.1)
ALK PHOS: 56 U/L (ref 40–115)
ALT: 13 U/L (ref 9–46)
AST: 18 U/L (ref 10–40)
BUN: 10 mg/dL (ref 7–25)
CALCIUM: 9.5 mg/dL (ref 8.6–10.3)
CO2: 28 mmol/L (ref 20–31)
CREATININE: 0.84 mg/dL (ref 0.60–1.35)
Chloride: 104 mmol/L (ref 98–110)
GFR, Est African American: >89 mL/min (ref >=60)
GFR, Est Non African American: >89 mL/min (ref >=60)
Glucose, Bld: 90 mg/dL (ref 65–99)
POTASSIUM: 4.4 mmol/L (ref 3.5–5.3)
Sodium: 141 mmol/L (ref 135–146)
Total Bilirubin: 0.3 mg/dL (ref 0.2–1.2)
Total Protein: 6.6 g/dL (ref 6.1–8.1)

## 2015-05-15 LAB — HEPATITIS B SURFACE ANTIBODY,QUALITATIVE: Hep B S Ab: POSITIVE — AB

## 2015-05-15 LAB — HEPATITIS B CORE ANTIBODY, TOTAL: Hep B Core Total Ab: NONREACTIVE

## 2015-05-15 LAB — HEPATITIS C ANTIBODY: HCV Ab: NEGATIVE

## 2015-05-15 LAB — HEPATITIS B SURFACE ANTIGEN: Hepatitis B Surface Ag: NEGATIVE

## 2015-05-16 LAB — URINALYSIS
Bilirubin Urine: NEGATIVE
GLUCOSE, UA: NEGATIVE
HGB URINE DIPSTICK: NEGATIVE
Ketones, ur: NEGATIVE
LEUKOCYTES UA: NEGATIVE
Nitrite: NEGATIVE
Specific Gravity, Urine: 1.029 (ref 1.001–1.035)
pH: 5.5 (ref 5.0–8.0)

## 2015-05-16 LAB — HIV-1 RNA ULTRAQUANT REFLEX TO GENTYP+
HIV 1 RNA QUANT: 2286 {copies}/mL — AB (ref <20)
HIV-1 RNA QUANT, LOG: 3.36 {Log_copies}/mL — AB (ref <1.30)

## 2015-05-16 LAB — RPR

## 2015-05-17 LAB — URINE CYTOLOGY ANCILLARY ONLY
CHLAMYDIA, DNA PROBE: NEGATIVE
Neisseria Gonorrhea: NEGATIVE

## 2015-05-17 LAB — T-HELPER CELL (CD4) - (RCID CLINIC ONLY)
CD4 T CELL ABS: 790 /uL (ref 400–2700)
CD4 T CELL HELPER: 31 % — AB (ref 33–55)

## 2015-05-18 LAB — QUANTIFERON TB GOLD ASSAY (BLOOD)
INTERFERON GAMMA RELEASE ASSAY: NEGATIVE
QUANTIFERON NIL VALUE: 0.14 [IU]/mL
Quantiferon Tb Ag Minus Nil Value: <0 IU/mL

## 2015-05-19 LAB — HLA B*5701: HLA-B 5701 W/RFLX HLA-B HIGH: NEGATIVE

## 2015-05-25 LAB — HIV-1 GENOTYPR PLUS

## 2015-06-21 ENCOUNTER — Ambulatory Visit: Payer: Self-pay | Admitting: Infectious Disease

## 2015-06-22 ENCOUNTER — Other Ambulatory Visit: Payer: Self-pay | Admitting: *Deleted

## 2015-06-22 MED ORDER — EFAVIRENZ-EMTRICITAB-TENOFOVIR 600-200-300 MG PO TABS: 1.0000 | ORAL_TABLET | Freq: Every day | ORAL | Status: DC

## 2015-07-20 ENCOUNTER — Encounter: Payer: Self-pay | Admitting: Infectious Disease

## 2015-08-16 ENCOUNTER — Encounter: Payer: Self-pay | Admitting: Infectious Disease

## 2015-08-16 ENCOUNTER — Ambulatory Visit: Payer: Self-pay

## 2015-08-16 ENCOUNTER — Ambulatory Visit: Payer: Self-pay | Admitting: *Deleted

## 2015-08-16 ENCOUNTER — Ambulatory Visit (INDEPENDENT_AMBULATORY_CARE_PROVIDER_SITE_OTHER): Payer: Self-pay | Admitting: Infectious Disease

## 2015-08-16 VITALS — BP 99/63 | HR 65 | Temp 97.2°F | Ht 68.0 in | Wt 138.0 lb

## 2015-08-16 DIAGNOSIS — Z113 Encounter for screening for infections with a predominantly sexual mode of transmission: Secondary | ICD-10-CM

## 2015-08-16 DIAGNOSIS — Z79899 Other long term (current) drug therapy: Secondary | ICD-10-CM

## 2015-08-16 DIAGNOSIS — Z21 Asymptomatic human immunodeficiency virus [HIV] infection status: Secondary | ICD-10-CM

## 2015-08-16 DIAGNOSIS — K6282 Dysplasia of anus: Secondary | ICD-10-CM

## 2015-08-16 DIAGNOSIS — F1210 Cannabis abuse, uncomplicated: Secondary | ICD-10-CM

## 2015-08-16 DIAGNOSIS — F1290 Cannabis use, unspecified, uncomplicated: Secondary | ICD-10-CM

## 2015-08-16 DIAGNOSIS — Z720 Tobacco use: Secondary | ICD-10-CM

## 2015-08-16 DIAGNOSIS — B20 Human immunodeficiency virus [HIV] disease: Secondary | ICD-10-CM

## 2015-08-16 DIAGNOSIS — M419 Scoliosis, unspecified: Secondary | ICD-10-CM

## 2015-08-16 HISTORY — DX: Cannabis use, unspecified, uncomplicated: F12.90

## 2015-08-16 LAB — CBC WITH DIFFERENTIAL/PLATELET
BASOS ABS: 69 {cells}/uL (ref 0–200)
Basophils Relative: 1 %
EOS PCT: 2 %
Eosinophils Absolute: 138 cells/uL (ref 15–500)
HEMATOCRIT: 45.2 % (ref 38.5–50.0)
HEMOGLOBIN: 15.2 g/dL (ref 13.2–17.1)
LYMPHS ABS: 3243 {cells}/uL (ref 850–3900)
LYMPHS PCT: 47 %
MCH: 29.6 pg (ref 27.0–33.0)
MCHC: 33.6 g/dL (ref 32.0–36.0)
MCV: 88.1 fL (ref 80.0–100.0)
MPV: 10.3 fL (ref 7.5–12.5)
Monocytes Absolute: 621 cells/uL (ref 200–950)
Monocytes Relative: 9 %
NEUTROS PCT: 41 %
Neutro Abs: 2829 cells/uL (ref 1500–7800)
Platelets: 297 10*3/uL (ref 140–400)
RBC: 5.13 MIL/uL (ref 4.20–5.80)
RDW: 13.9 % (ref 11.0–15.0)
WBC: 6.9 10*3/uL (ref 3.8–10.8)

## 2015-08-16 LAB — COMPLETE METABOLIC PANEL WITH GFR
ALBUMIN: 4.2 g/dL (ref 3.6–5.1)
ALK PHOS: 65 U/L (ref 40–115)
ALT: 13 U/L (ref 9–46)
AST: 22 U/L (ref 10–40)
BUN: 11 mg/dL (ref 7–25)
CO2: 27 mmol/L (ref 20–31)
CREATININE: 0.9 mg/dL (ref 0.60–1.35)
Calcium: 9.4 mg/dL (ref 8.6–10.3)
Chloride: 104 mmol/L (ref 98–110)
GFR, Est African American: >89 mL/min (ref >=60)
GFR, Est Non African American: >89 mL/min (ref >=60)
GLUCOSE: 69 mg/dL (ref 65–99)
POTASSIUM: 4.1 mmol/L (ref 3.5–5.3)
SODIUM: 139 mmol/L (ref 135–146)
TOTAL PROTEIN: 7 g/dL (ref 6.1–8.1)
Total Bilirubin: 0.3 mg/dL (ref 0.2–1.2)

## 2015-08-16 LAB — LIPID PANEL
CHOLESTEROL: 113 mg/dL — AB (ref 125–200)
HDL: 47 mg/dL (ref >=40)
LDL CALC: 50 mg/dL (ref <130)
Total CHOL/HDL Ratio: 2.4 Ratio (ref <=5.0)
Triglycerides: 81 mg/dL (ref <150)
VLDL: 16 mg/dL (ref <30)

## 2015-08-16 MED ORDER — EMTRICITAB-RILPIVIR-TENOFOV AF 200-25-25 MG PO TABS: 1.0000 | ORAL_TABLET | Freq: Every day | ORAL | Status: DC

## 2015-08-16 NOTE — Progress Notes (Signed)
HPI: Irma NewnessDonavon A Abdelrahman is a 25 y.o. male transfer [facility] who is seeing Dr. Daiva EvesVan Dam to establish care here in our clinic for his HIV.  He has been taking Atripla for quite some time, but states he has been out of the medication for around 4 months. He does have 1 bottle left of Atripla with ~3 weeks of medication left.  His ADAP will be sent in as soon as he brings in paperwork (likely next week). He states he hasn't had many side effects with Atripla and usually does not miss doses.   Allergies: No Known Allergies  Vitals: Temp: 97.2 F (36.2 C) (07/05 0848) Temp Source: Oral (07/05 0848) BP: 99/63 mmHg (07/05 0848) Pulse Rate: 65 (07/05 0848)  Past Medical History: Past Medical History  Diagnosis Date  . HIV (human immunodeficiency virus infection) (HCC)   . Marijuana use 08/16/2015    Social History: Social History   Social History  . Marital Status: Married    Spouse Name: N/A  . Number of Children: N/A  . Years of Education: N/A   Social History Main Topics  . Smoking status: Current Every Day Smoker  . Smokeless tobacco: None  . Alcohol Use: No  . Drug Use: Yes    Special: Marijuana  . Sexual Activity: Not Asked   Other Topics Concern  . None   Social History Narrative    Previous Regimen: Atripla  Current Regimen: Atripla --> Odefsey  Labs: HIV 1 RNA QUANT (copies/mL)  Date Value  05/15/2015 2286*   CD4 T CELL ABS (/uL)  Date Value  05/15/2015 790   HEP B S AB (no units)  Date Value  05/15/2015 POS*   HEPATITIS B SURFACE AG (no units)  Date Value  05/15/2015 NEGATIVE   HCV AB (no units)  Date Value  05/15/2015 NEGATIVE    CrCl: CrCl cannot be calculated (Patient has no serum creatinine result on file.).  Lipids:    Component Value Date/Time   CHOL 107* 05/15/2015 1559   TRIG 92 05/15/2015 1559   HDL 47 05/15/2015 1559   CHOLHDL 2.3 05/15/2015 1559   VLDL 18 05/15/2015 1559   LDLCALC 42 05/15/2015 1559    Assessment: Armanie  states he has good adherence with his Atripla when he has enough bottles. We will get labs on him today to assess his viral load and CD4 count since he had a period of time of ~4 months where he was not taking anything.  We will switch him over to [facility], which he is agreeable to. I counseled him on the importance of not taking any over the counter medication for acid reflux such as Tums, Prilosec, or Zantac.  Instructed him to call the [facility] pharmacy phone before starting any new medications or if any new medications were prescribed to him. Also instructed him to take the [facility] with a high calorie meal of at least ~500 calories, preferably with high fat content. He states he works at [employer] and eats a [employer] roast beef sub every day around lunch.  I told him that would be fine to take his medication then.  I also gave him a keychain pill bottle which he was really excited about.  I scheduled a follow-up visit with me on August 11th (Friday) for adherence counseling and to see how the ADAP process is going.    Recommendations: Labs today Atripla --> Change to [facility] once ADAP is renewed Follow-up with [facility] pharmacist August 11th  at 9am.   Cassie L. Hinton DyerStewart, BS, PharmD Clinical Infectious Disease Pharmacist [facility] 08/16/2015, 9:56 AM

## 2015-08-16 NOTE — BH Specialist Note (Signed)
Counselor met with Scott Butler today in the exam room for a warm hand off via Dr. Lucianne Lei dam.  Patient was oriented times four with "high" affect.  Patient reeked of mariajuana but denied using at first.  Later in the conversation patient admitted he had smoked at 7:30 this morning.  Counselor educated patient about the use of substances especially illegal ones.  Patient said he has complete control with his use and does not consider marijuana a real drug. Patient was giddy and laughed throughout time together.  Counselor made appropriate introductions and communicated about counseling services at [facility]. Patient said he was very good about taking his medications but did allow for an overlap on his ADAPT.  Patient indicated that he moved to [facility] and should not have any problems getting to all his appointment now since he does not have to travel to [place]. Counselor provided support and encouragement accordingly with patient today.  Counselor recommended that patient make an appointment.  Rolena Infante, MA,LPC Alcohol [facility] Drug Services/[facility]

## 2015-08-16 NOTE — Progress Notes (Signed)
Chief complaint: establish care for HIV and also left ankle pain  Subjective:    Patient ID: Scott Butler, male    DOB: 1990/06/27, 25 y.o.   MRN: [id number]  HPI  25 year old Caucasian man with HIV that HAD been well [facility]. He had failed to renew ADAP and had been off meds x 6 months. [place] and desired to transfer his care here. He was given one month bottle of Atripla and then a 2nd one after he came for intake and  Was foun to have low grade viremia.   We discussed all of the currently available STR and he wished to go with ODEFSEY.  He had suffered an ankle injury it seems and noticed this at work with pain in posterior ankle going up the calf.  Past Medical History  Diagnosis Date  . HIV (human immunodeficiency virus infection) (HCC)   . Marijuana use 08/16/2015    Past Surgical History  Procedure Laterality Date  . Removal of anal warts      No family history on file.    Social History   Social History  . Marital Status: Married    Spouse Name: N/A  . Number of Children: N/A  . Years of Education: N/A   Social History Main Topics  . Smoking status: Current Every Day Smoker  . Smokeless tobacco: None  . Alcohol Use: No  . Drug Use: Yes    Special: Marijuana  . Sexual Activity: Not Asked   Other Topics Concern  . None   Social History Narrative    No Known Allergies   Current outpatient prescriptions:  .  emtricitabine-rilpivir-tenofovir AF (ODEFSEY) 200-25-25 MG TABS tablet, Take 1 tablet by mouth daily with breakfast., Disp: 30 tablet, Rfl: 11    Review of Systems  Constitutional: Negative for fever, chills, diaphoresis, activity change, appetite change, fatigue and unexpected weight change.  HENT: Negative for congestion, rhinorrhea, sinus pressure, sneezing, sore throat and trouble swallowing.   Eyes: Negative for photophobia and visual disturbance.  Respiratory: Negative for cough, chest  tightness, shortness of breath, wheezing and stridor.   Cardiovascular: Negative for chest pain, palpitations and leg swelling.  Gastrointestinal: Negative for nausea, vomiting, abdominal pain, diarrhea, constipation, blood in stool, abdominal distention and anal bleeding.  Genitourinary: Negative for dysuria, hematuria, flank pain and difficulty urinating.  Musculoskeletal: Positive for arthralgias. Negative for myalgias, back pain, joint swelling and gait problem.  Skin: Negative for color change, pallor, rash and wound.  Neurological: Negative for dizziness, tremors, weakness and light-headedness.  Hematological: Negative for adenopathy. Does not bruise/bleed easily.  Psychiatric/Behavioral: Negative for behavioral problems, confusion, sleep disturbance, dysphoric mood, decreased concentration and agitation.       Objective:   Physical Exam  Constitutional: He is oriented to person, place, and time. He appears well-developed and well-nourished.  HENT:  Head: Normocephalic and atraumatic.  Eyes: Conjunctivae and EOM are normal.  Neck: Normal range of motion. Neck supple.  Cardiovascular: Normal rate and regular rhythm.   Pulmonary/Chest: Effort normal. No respiratory distress. He has no wheezes.  Abdominal: Soft. He exhibits no distension.  Musculoskeletal: Normal range of motion. He exhibits no edema or tenderness.       Feet:  Neurological: He is alert and oriented to person, place, and time.  Skin: Skin is warm and dry. No rash noted. No erythema. No pallor.  Psychiatric: He has a normal mood and affect. His behavior is  normal. Judgment and thought content normal.          Assessment & Plan:   HIV disease: switch to ODEFSEY with food and avoid PPI, H2 blockers, rtc to see Cassie. Get labs today and then in 4 weeks. Also to see me in 5-6 weeks. Needs ADAP filled.  Ankle pain: seems MSK  Marijuana use: not likely to stop, I am not overly concerned if no other drugs. Jodie  to also meet with him  Tobacco use: need to get him to stop  Partners: asked him to let partners friends who are HIV - know about PrEP  I spent greater than 60 minutes with the patient  including greater than 50% of time in face to face counsel of the patient re his HIV, his new regimen, STI risk and prevention, tobaccco use, ankle injury, THC use and in coordination of their care.

## 2015-08-17 LAB — T-HELPER CELL (CD4) - (RCID CLINIC ONLY)
CD4 T CELL ABS: 940 /uL (ref 400–2700)
CD4 T CELL HELPER: 27 % — AB (ref 33–55)

## 2015-08-17 LAB — CYTOLOGY, (ORAL, ANAL, URETHRAL) ANCILLARY ONLY
CHLAMYDIA, DNA PROBE: NEGATIVE
Chlamydia: NEGATIVE
NEISSERIA GONORRHEA: NEGATIVE
Neisseria Gonorrhea: NEGATIVE

## 2015-08-17 LAB — RPR

## 2015-08-17 LAB — URINE CYTOLOGY ANCILLARY ONLY
Chlamydia: NEGATIVE
Neisseria Gonorrhea: NEGATIVE

## 2015-08-18 LAB — HIV-1 RNA ULTRAQUANT REFLEX TO GENTYP+
HIV 1 RNA Quant: 96 copies/mL — ABNORMAL HIGH (ref <20)
HIV-1 RNA QUANT, LOG: 1.98 {Log_copies}/mL — AB (ref <1.30)

## 2015-08-21 ENCOUNTER — Encounter: Payer: Self-pay | Admitting: Infectious Disease

## 2015-09-13 ENCOUNTER — Encounter: Payer: Self-pay | Admitting: Licensed Clinical Social Worker

## 2015-09-21 ENCOUNTER — Other Ambulatory Visit: Payer: Self-pay | Admitting: *Deleted

## 2015-09-21 ENCOUNTER — Emergency Department (HOSPITAL_COMMUNITY)
Admission: EM | Admit: 2015-09-21 | Discharge: 2015-09-21 | Disposition: A | Discharge status: Home / Self Care | Payer: Self-pay | Attending: Emergency Medicine | Admitting: Emergency Medicine

## 2015-09-21 ENCOUNTER — Encounter (HOSPITAL_COMMUNITY): Payer: Self-pay | Admitting: *Deleted

## 2015-09-21 DIAGNOSIS — F17200 Nicotine dependence, unspecified, uncomplicated: Secondary | ICD-10-CM | POA: Insufficient documentation

## 2015-09-21 DIAGNOSIS — K047 Periapical abscess without sinus: Secondary | ICD-10-CM

## 2015-09-21 DIAGNOSIS — K029 Dental caries, unspecified: Secondary | ICD-10-CM

## 2015-09-21 DIAGNOSIS — Z720 Tobacco use: Secondary | ICD-10-CM

## 2015-09-21 MED ORDER — PENICILLIN V POTASSIUM 500 MG PO TABS: 1000.0000 mg | ORAL_TABLET | Freq: Two times a day (BID) | ORAL | 0 refills | Status: DC

## 2015-09-21 NOTE — ED Provider Notes (Signed)
[facility]-EMERGENCY DEPT Provider Note   CSN: [id number] Arrival date & time: 09/21/15  1017  First Provider Contact:   First MD Initiated Contact with Patient 09/21/15 1023    By signing my name below, I, Scott Butler, attest that this documentation has been prepared under the direction and in the presence of Scott StraussMercedes Camprubi-Soms, PA-C Electronically Signed: Charline BillsEssence Butler, ED Scribe 09/21/2015 at 10:36 AM.  History   Chief Complaint Chief Complaint  Patient presents with  . Dental Pain   HPI The history is provided by the patient and medical records. No language interpreter was used.  Dental Pain   This is a new problem. The current episode started more than 1 week ago. The problem occurs daily. The problem has not changed since onset.The pain is at a severity of 7/10. The pain is moderate. Treatments tried: oragel. The treatment provided mild relief.   HPI Comments: Scott NewnessDonavon A Butler is a 25 y.o. male who presents to the Emergency Department complaining of gradually improving right lower dental pain onset last week. Pt reports a chipped wisdom tooth for several months. He reports 7/10 intermittent sharp, throbbing pain that radiates across the front of his teeth and into the upper R side. He reports increased pain with chewing. Pt has tried applying Orajel this morning with minimal relief. Denies gum swelling or drainage, drooling, trismus, ear pain/drainage, rhinorrhea, sore throat, fevers, chills, CP, SOB, abd pain, N/V/D/C, hematuria, dysuria, myalgias, arthralgias, numbness, tingling, weakness, or face/neck swelling. Pt does not currently have a dentist. Pt is a current smoker. +HIV, compliant on meds but states he ran out last week and his ID doctor hasn't gotten the rx refilled yet. The ID clinic told him they are working on getting him into a dental clinic through their office but that won't happen for another week or so.   Past Medical History:  Diagnosis Date  . HIV (human  immunodeficiency virus infection) (HCC)   . Marijuana use 08/16/2015   Patient Active Problem List   Diagnosis Date Noted  . Marijuana use 08/16/2015  . Scoliosis 06/23/2014  . 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 Anal dysplasia 03/25/2011  . Current tobacco use 12/31/2010  . Human immunodeficiency virus (HIV) infection (HCC) 09/21/2010    Past Surgical History:  Procedure Laterality Date  . removal of anal warts       Home Medications    Prior to Admission medications   Medication Sig Start Date End Date Taking? Authorizing Provider  emtricitabine-rilpivir-tenofovir AF (ODEFSEY) 200-25-25 MG TABS tablet Take 1 tablet by mouth daily with breakfast. 08/16/15   Scott Hissornelius N Van Dam, MD    Family History No family history on file.  Social History Social History  Substance Use Topics  . Smoking status: Current Every Day Smoker  . Smokeless tobacco: Not on file  . Alcohol use No    Allergies   Review of patient's allergies indicates no known allergies.   Review of Systems Review of Systems  Constitutional: Negative for chills and fever.  HENT: Positive for dental problem. Negative for drooling, ear discharge, ear pain, facial swelling, rhinorrhea, sore throat and trouble swallowing.   Respiratory: Negative for shortness of breath.   Cardiovascular: Negative for chest pain.  Gastrointestinal: Negative for abdominal pain, constipation, diarrhea, nausea and vomiting.  Genitourinary: Negative for dysuria and hematuria.  Musculoskeletal: Negative for arthralgias, myalgias and neck pain.  Skin: Negative for color change.  Allergic/Immunologic: Positive for immunocompromised state (HIV).  Neurological: Negative for  weakness and numbness.  Psychiatric/Behavioral: Negative for confusion.   A complete 10 system review of systems was obtained and all systems are negative except as noted in the HPI and PMH.   Physical Exam Updated Vital Signs BP 110/71 (BP  Location: Right Arm)   Pulse 65   Temp 98.4 F (36.9 C) (Oral)   Resp 19   SpO2 100%   Physical Exam  Constitutional: He is oriented to person, place, and time. Vital signs are normal. He appears well-developed and well-nourished.  Non-toxic appearance. No distress.  Afebrile, nontoxic, NAD  HENT:  Head: Normocephalic and atraumatic.  Nose: Nose normal.  Mouth/Throat: Uvula is midline, oropharynx is clear and moist and mucous membranes are normal. There is trismus in the jaw. Dental caries present. No dental abscesses or uvula swelling.  Nose clear. Oropharynx clear and moist, without uvular swelling or deviation, no trismus or drooling, no tonsillar swelling or erythema, no exudates. R lower molar #32 decayed with no surrounding gingival swelling, no dental abscess. No evidence of ludwig's.   Eyes: Conjunctivae and EOM are normal. Right eye exhibits no discharge. Left eye exhibits no discharge.  Neck: Normal range of motion. Neck supple.  Cardiovascular: Normal rate.   Pulmonary/Chest: Effort normal. No respiratory distress.  Abdominal: Normal appearance. He exhibits no distension.  Musculoskeletal: Normal range of motion.  Neurological: He is alert and oriented to person, place, and time. He has normal strength. No sensory deficit.  Skin: Skin is warm, dry and intact. No rash noted.  Psychiatric: He has a normal mood and affect.  Nursing note and vitals reviewed.  ED Treatments / Results  Labs (all labs ordered are listed, but only abnormal results are displayed) Labs Reviewed - No data to display  EKG  EKG Interpretation None       Radiology No results found.  Procedures Procedures (including critical care time) DIAGNOSTIC STUDIES: Oxygen Sa1turation is 100% on RA, norma by my interpretation.    COORDINATION OF CARE: 10:29 AM-Discussed treatment plan with pt at bedside and pt agreed to plan.   Medications Ordered in ED Medications - No data to display   Initial  Impression / Assessment and Plan / ED Course  I have reviewed the triage vital signs and the nursing notes.  Pertinent labs & imaging results that were available during my care of the patient were reviewed by me and considered in my medical decision making (see chart for details).  Clinical Course    25 y.o. male here with Dental pain associated with dental decay and possible dental infection with patient afebrile, non toxic appearing and swallowing secretions well. I gave patient referral to dentist and stressed the importance of dental follow up for ultimate management of dental pain.  I have also discussed reasons to return immediately to the ER.  Patient expresses understanding and agrees with plan.  I will also give PCN VK and discussed tylenol/motrin/OTC remedies for pain control. Smoking cessation advised.   I personally performed the services described in this documentation, which was scribed in my presence. The recorded information has been reviewed and is accurate.  BP 110/71 (BP Location: Right Arm)   Pulse 65   Temp 98.4 F (36.9 C) (Oral)   Resp 19   SpO2 100%    Final Clinical Impressions(s) / ED Diagnoses   Final diagnoses:  Pain due to dental caries  Dental infection  Tobacco use    New Prescriptions New Prescriptions   PENICILLIN V POTASSIUM (VEETID)  500 MG TABLET    Take 2 tablets (1,000 mg total) by mouth 2 (two) times daily. X 7 days     Allen Derry, PA-C 09/21/15 1041    Maia Plan, MD 09/21/15 [phone]

## 2015-09-21 NOTE — ED Triage Notes (Signed)
PT reports multiple dental pain areas.

## 2015-09-21 NOTE — Telephone Encounter (Signed)
Pt states that he has received a letter that his ADAP was approved.  [facility] having difficulty running rx through with ADAP.  RN asked [facility] financial aid assistant to check with [place] ADAP regarding approval.  Pt having significant dental pain, miserable.  RN [facility] patient [facility] to be [facility].  Pt also completed a Access Dental application.  Electronic confirmation not currently available for [facility] to process refill.  [facility] ADAP financial assistant [facility] return call for confirmation #s to share with [facility].  [facility] for evaluation of dental pain.

## 2015-09-21 NOTE — Discharge Instructions (Signed)
Apply warm compresses to jaw throughout the day. Take antibiotic until finished. Use tylenol or motrin as needed for pain. Perform salt water swishes to help with pain/swelling. Use over the counter oragel to help with symptoms. Followup with a dentist is very important for ongoing evaluation and management of recurrent dental pain, call the dentist listed above in the next 24-48 hours to schedule ongoing dental care, or use the list below to find a dentist. STOP SMOKING! Return to emergency department for emergent changing or worsening symptoms.    Emergency Department Resource Guide 1) Find a Doctor and Pay Out of Pocket Although you won't have to find out who is covered by your insurance plan, it is a good idea to ask around and get recommendations. You will then need to call the office and see if the doctor you have chosen will accept you as a new patient and what types of options they offer for patients who are self-pay. Some doctors offer discounts or will set up payment plans for their patients who do not have insurance, but you will need to ask so you aren't surprised when you get to your appointment.  2) Contact Your Local Health Department Not all health departments have doctors that can see patients for sick visits, but many do, so it is worth a call to see if yours does. If you don't know where your local health department is, you can check in your phone book. The CDC also has a tool to help you locate your state's health department, and many state websites also have listings of all of their local health departments.  3) Find a Walk-in Clinic If your illness is not likely to be very severe or complicated, you may want to try a walk in clinic. These are popping up all over the country in pharmacies, drugstores, and shopping centers. They're usually staffed by nurse practitioners or physician assistants that have been trained to treat common illnesses and complaints. They're usually fairly  quick and inexpensive. However, if you have serious medical issues or chronic medical problems, these are probably not your best option.  No Primary Care Doctor: Call Health Connect at  [phone] - they can help you locate a primary care doctor that  accepts your insurance, provides certain services, etc. Physician Referral Service- [phone]  Chronic Pain Problems: Organization         Address  Phone   Notes  [facility]  [phone] Patients need to be referred by their primary care doctor.   Medication Assistance: Organization         Address  Phone   Notes  [facility] [address] [place], [place] [id number] [phone] --Must be a resident of [place] -- Must have NO insurance coverage whatsoever (no Medicaid/ Medicare, etc.) -- The pt. MUST have a primary care doctor that directs their care regularly and follows them in the community   [facility]ist  [phone]   [employer]  [phone]     Dental Care: Organization         Address  Phone  Notes  [facility] Department of [facility] [address], [place] [phone] Accepts children up to age 27 who are enrolled in [place] or [place] Health Choice; pregnant women with a Medicaid card; and children who have applied for Medicaid or [place] Health Choice, but were declined, whose parents can pay a reduced  fee at time of service.  [facility] Department of [facility]  [address], [place] [phone] Accepts children up to age 42 who are enrolled in [place] or [place] Health Choice; pregnant women with a Medicaid card; and children who have applied for Medicaid or [place] Health Choice, but were declined, whose parents can pay a reduced fee at time of service.  [facility]  [address] [place], [place] [phone] Patients are seen by appointment only. Walk-ins  are not accepted. [facility] will see patients 16 years of age and older. Monday - Tuesday (8am-5pm) Most Wednesdays (8:30-5pm) $30 per visit, cash only  [facility] Adult [facility] Access [facility]  [address], [facility] [phone] Patients are seen by appointment only. Walk-ins are not accepted. [facility] will see patients 15 years of age and older. One Wednesday Evening (Monthly: Volunteer Based).  $30 per visit, cash only  [employer]  [phone] for adults; Children under age 43, call Graduate Pediatric Dentistry at [phone]. Children aged 31-14, please call [phone] to request a pediatric application.  Dental services are provided in all areas of dental care including fillings, crowns and bridges, complete and partial dentures, implants, gum treatment, root canals, and extractions. Preventive care is also provided. Treatment is provided to both adults and children. Patients are selected via a lottery and there is often a waiting list.   [facility] [address] [place]  [phone] [email or web address]   [facility] [address] [place], [place] [phone], Ext. [phone] Second and Fourth Thursday of each month, opens at 6:30 AM; Clinic ends at 9 AM.  Patients are seen on a first-come first-served basis, and a limited number are seen during each clinic.   [facility]  [address] [place], [place] [phone]   Eligibility Requirements You must have lived in [place], [place], or [place] counties for at least the last three months.   You cannot be eligible for state or federal sponsored [place], including [employer], [place], or [employer].   You generally cannot be eligible for healthcare insurance through your employer.    How to apply: Eligibility screenings are held every Tuesday and Wednesday afternoon from 1:00 pm until 4:00 pm. You do not need an appointment  for the interview!  [facility] [address], [place], [place] [phone]   [facility] Health Department  [phone]   [facility] Health Department  [phone]   [facility]  [phone]

## 2015-09-21 NOTE — ED Notes (Signed)
Declined W/C at D/C and was escorted to lobby by RN. 

## 2015-09-22 ENCOUNTER — Ambulatory Visit (INDEPENDENT_AMBULATORY_CARE_PROVIDER_SITE_OTHER): Payer: Self-pay | Admitting: Pharmacist Clinician (PhC)/ Clinical Pharmacy Specialist

## 2015-09-22 ENCOUNTER — Other Ambulatory Visit (INDEPENDENT_AMBULATORY_CARE_PROVIDER_SITE_OTHER): Payer: Self-pay

## 2015-09-22 DIAGNOSIS — B20 Human immunodeficiency virus [HIV] disease: Secondary | ICD-10-CM

## 2015-09-22 DIAGNOSIS — Z21 Asymptomatic human immunodeficiency virus [HIV] infection status: Secondary | ICD-10-CM

## 2015-09-22 DIAGNOSIS — Z113 Encounter for screening for infections with a predominantly sexual mode of transmission: Secondary | ICD-10-CM

## 2015-09-22 LAB — CBC WITH DIFFERENTIAL/PLATELET
BASOS PCT: 0 %
Basophils Absolute: 0 cells/uL (ref 0–200)
EOS PCT: 2 %
Eosinophils Absolute: 138 cells/uL (ref 15–500)
HCT: 41.9 % (ref 38.5–50.0)
Hemoglobin: 13.9 g/dL (ref 13.2–17.1)
LYMPHS PCT: 42 %
Lymphs Abs: 2898 cells/uL (ref 850–3900)
MCH: 29.8 pg (ref 27.0–33.0)
MCHC: 33.2 g/dL (ref 32.0–36.0)
MCV: 89.7 fL (ref 80.0–100.0)
MONOS PCT: 8 %
MPV: 10.1 fL (ref 7.5–12.5)
Monocytes Absolute: 552 cells/uL (ref 200–950)
Neutro Abs: 3312 cells/uL (ref 1500–7800)
Neutrophils Relative %: 48 %
PLATELETS: 262 10*3/uL (ref 140–400)
RBC: 4.67 MIL/uL (ref 4.20–5.80)
RDW: 14 % (ref 11.0–15.0)
WBC: 6.9 10*3/uL (ref 3.8–10.8)

## 2015-09-22 LAB — T-HELPER CELL (CD4) - (RCID CLINIC ONLY)
CD4 % Helper T Cell: 34 % (ref 33–55)
CD4 T CELL ABS: 1000 /uL (ref 400–2700)

## 2015-09-22 LAB — COMPLETE METABOLIC PANEL WITH GFR
ALT: 11 U/L (ref 9–46)
AST: 16 U/L (ref 10–40)
Albumin: 3.8 g/dL (ref 3.6–5.1)
Alkaline Phosphatase: 60 U/L (ref 40–115)
BUN: 10 mg/dL (ref 7–25)
CHLORIDE: 105 mmol/L (ref 98–110)
CO2: 28 mmol/L (ref 20–31)
CREATININE: 0.86 mg/dL (ref 0.60–1.35)
Calcium: 9.1 mg/dL (ref 8.6–10.3)
GFR, Est African American: >89 mL/min (ref >=60)
GFR, Est Non African American: >89 mL/min (ref >=60)
Glucose, Bld: 96 mg/dL (ref 65–99)
Potassium: 4.4 mmol/L (ref 3.5–5.3)
Sodium: 140 mmol/L (ref 135–146)
Total Bilirubin: 0.3 mg/dL (ref 0.2–1.2)
Total Protein: 6.4 g/dL (ref 6.1–8.1)

## 2015-09-22 NOTE — Progress Notes (Signed)
Patient ID: Scott Butler, male   DOB: 25-Nov-1990, 25 y.o.   MRN: [id number] HPI: Scott Butler is a 25 y.o. male who is here for his HIV f/u with pharmacy.   Allergies: No Known Allergies  Vitals:    Past Medical History: Past Medical History:  Diagnosis Date  . HIV (human immunodeficiency virus infection) (HCC)   . Marijuana use 08/16/2015    Social History: Social History   Social History  . Marital status: Married    Spouse name: N/A  . Number of children: N/A  . Years of education: N/A   Social History Main Topics  . Smoking status: Current Every Day Smoker  . Smokeless tobacco: Never Used  . Alcohol use No  . Drug use:     Types: Marijuana  . Sexual activity: Not on file   Other Topics Concern  . Not on file   Social History Narrative  . No narrative on file    Previous Regimen:   Current Regimen: ATP  Labs: HIV 1 RNA Quant (copies/mL)  Date Value  08/16/2015 96 (H)  05/15/2015 2,286 (H)   CD4 T Cell Abs (/uL)  Date Value  08/16/2015 940  05/15/2015 790   Hep B S Ab (no units)  Date Value  05/15/2015 POS (A)   Hepatitis B Surface Ag (no units)  Date Value  05/15/2015 NEGATIVE   HCV Ab (no units)  Date Value  05/15/2015 NEGATIVE    CrCl: CrCl cannot be calculated (Unknown ideal weight.).  Lipids:    Component Value Date/Time   CHOL 113 (L) 08/16/2015 1016   TRIG 81 08/16/2015 1016   HDL 47 08/16/2015 1016   CHOLHDL 2.4 08/16/2015 1016   VLDL 16 08/16/2015 1016   LDLCALC 50 08/16/2015 1016    Assessment: Scott Butler was a [facility] tx that was well controlled on ATP. He was off of therapy for a couple of months prior to that VL in April. He got a supply from us for a 1 month supply of ATP. Apparently, he ran out about a week ago. He [facility] and was told that they could process the Rx a the time. It looks like his ADAP has been approved. Called [facility] today to confirm if it's still an issue. It ran  through fine and he is going to pick up today. Therefore, he has been off for about a week.   He presented to the ED yesterday with significant toothache. It looks like one of his back molars has some major decay. I think [name] aware but will confirm with her to see if they can see him sooner. Gave him some supply of ibuprofen to help with the pain.   He also asked about his STD results. Told him that he was neg for chlamydia/gonarr/syphyllis.   Recommendations:  Pick up Odefsey and take 1 PO qday with a meal Labs [facility]  Clide CliffPham, Adrin Julian Quang, PharmD Clinical Infectious Disease Pharmacist [facility] 09/22/2015, 9:48 AM

## 2015-09-23 LAB — RPR

## 2015-09-26 LAB — HIV-1 RNA QUANT-NO REFLEX-BLD
HIV 1 RNA Quant: 58 copies/mL — ABNORMAL HIGH (ref <20)
HIV-1 RNA Quant, Log: 1.76 Log copies/mL — ABNORMAL HIGH (ref <1.30)

## 2015-09-29 ENCOUNTER — Telehealth: Payer: Self-pay | Admitting: Pharmacist Clinician (PhC)/ Clinical Pharmacy Specialist

## 2015-09-29 NOTE — Telephone Encounter (Signed)
Called him to see if he can come in to fill out form for the dental clinic but no answer.

## 2015-10-09 ENCOUNTER — Ambulatory Visit (INDEPENDENT_AMBULATORY_CARE_PROVIDER_SITE_OTHER): Payer: Self-pay | Admitting: Infectious Disease

## 2015-10-09 ENCOUNTER — Encounter: Payer: Self-pay | Admitting: Infectious Disease

## 2015-10-09 VITALS — BP 94/59 | HR 69 | Temp 98.0°F | Wt 135.0 lb

## 2015-10-09 DIAGNOSIS — F1290 Cannabis use, unspecified, uncomplicated: Secondary | ICD-10-CM

## 2015-10-09 DIAGNOSIS — F1210 Cannabis abuse, uncomplicated: Secondary | ICD-10-CM

## 2015-10-09 DIAGNOSIS — B20 Human immunodeficiency virus [HIV] disease: Secondary | ICD-10-CM

## 2015-10-09 DIAGNOSIS — Z23 Encounter for immunization: Secondary | ICD-10-CM

## 2015-10-09 DIAGNOSIS — Z21 Asymptomatic human immunodeficiency virus [HIV] infection status: Secondary | ICD-10-CM

## 2015-10-09 NOTE — Progress Notes (Signed)
Chief complaint:return to care at [facility]  Subjective:    Patient ID: Scott Butler, male    DOB: 1990/03/24, 25 y.o.   MRN: [id number]  HPI  25 year old Caucasian man with HIV that HAD been well controlled on Atripla at [facility].   He established care with [place] and got him on to [facility]. He did miss at least one week due to problems with ADAP but is nicely suppressed and understands how to take his meds correctly  Lab Results  Component Value Date   HIV1RNAQUANT 58 (H) 09/22/2015   HIV1RNAQUANT 96 (H) 08/16/2015   HIV1RNAQUANT 2,286 (H) 05/15/2015   Lab Results  Component Value Date   CD4TABS 1,000 09/22/2015   CD4TABS 940 08/16/2015   CD4TABS 790 05/15/2015     Past Medical History:  Diagnosis Date  . HIV (human immunodeficiency virus infection) (HCC)   . Marijuana use 08/16/2015    Past Surgical History:  Procedure Laterality Date  . removal of anal warts      No family history on file.    Social History   Social History  . Marital status: Married    Spouse name: N/A  . Number of children: N/A  . Years of education: N/A   Social History Main Topics  . Smoking status: Current Every Day Smoker  . Smokeless tobacco: Never Used  . Alcohol use No  . Drug use:     Types: Marijuana  . Sexual activity: Not Asked   Other Topics Concern  . None   Social History Narrative  . None    No Known Allergies   Current Outpatient Prescriptions:  .  emtricitabine-rilpivir-tenofovir AF (ODEFSEY) 200-25-25 MG TABS tablet, Take 1 tablet by mouth daily with breakfast., Disp: 30 tablet, Rfl: 11 .  penicillin v potassium (VEETID) 500 MG tablet, Take 2 tablets (1,000 mg total) by mouth 2 (two) times daily. X 7 days (Patient not taking: Reported on 10/09/2015), Disp: 28 tablet, Rfl: 0    Review of Systems  Constitutional: Negative for activity change, appetite change, chills, diaphoresis, fatigue, fever and unexpected weight change.  HENT: Negative for congestion,  rhinorrhea, sinus pressure, sneezing, sore throat and trouble swallowing.   Eyes: Negative for photophobia and visual disturbance.  Respiratory: Negative for cough, chest tightness, shortness of breath, wheezing and stridor.   Cardiovascular: Negative for chest pain, palpitations and leg swelling.  Gastrointestinal: Negative for abdominal distention, abdominal pain, anal bleeding, blood in stool, constipation, diarrhea, nausea and vomiting.  Genitourinary: Negative for difficulty urinating, dysuria, flank pain and hematuria.  Musculoskeletal: Negative for back pain, gait problem, joint swelling and myalgias.  Skin: Negative for color change, pallor, rash and wound.  Neurological: Negative for dizziness, tremors, weakness and light-headedness.  Hematological: Negative for adenopathy. Does not bruise/bleed easily.  Psychiatric/Behavioral: Negative for agitation, behavioral problems, confusion, decreased concentration, dysphoric mood and sleep disturbance.       Objective:   Physical Exam  Constitutional: He is oriented to person, place, and time. He appears well-developed and well-nourished.  HENT:  Head: Normocephalic and atraumatic.  Eyes: Conjunctivae and EOM are normal.  Neck: Normal range of motion. Neck supple.  Cardiovascular: Normal rate and regular rhythm.   Pulmonary/Chest: Effort normal. No respiratory distress. He has no wheezes.  Abdominal: Soft. He exhibits no distension.  Musculoskeletal: Normal range of motion. He exhibits no edema or tenderness.  Neurological: He is alert and oriented to person, place, and time.  Skin: Skin is warm and dry. No  rash noted. No erythema. No pallor.  Psychiatric: He has a normal mood and affect. His behavior is normal. Judgment and thought content normal.          Assessment & Plan:   HIV disease: continue ODEFSEY with food and avoid PPI, H2 blockers, rtc to see in December Needs ADAP filled.  Marijuana use: I am not concerned about  abuse based on what he tells me. He says he is able to maintain a 40 hour work week without problems. He did come in smelling of marijuana last time  Tobacco use: he is changing to vaping  I spent greater than 25 minutes with the patient  including greater than 50% of time in face to face counsel of the patient re his HIV, THC use and in coordination of his care.

## 2015-10-09 NOTE — Patient Instructions (Signed)
If we dont have flu shot today come back in one month for RN visit for flu shot  Make sure ALWAYS take meds with chewable food and no antacids

## 2016-01-29 ENCOUNTER — Other Ambulatory Visit: Payer: Self-pay

## 2016-02-19 ENCOUNTER — Ambulatory Visit: Payer: Self-pay | Admitting: Infectious Disease

## 2016-03-18 ENCOUNTER — Encounter: Payer: Self-pay | Admitting: Infectious Disease

## 2016-04-10 ENCOUNTER — Other Ambulatory Visit (INDEPENDENT_AMBULATORY_CARE_PROVIDER_SITE_OTHER): Payer: Self-pay

## 2016-04-10 DIAGNOSIS — Z79899 Other long term (current) drug therapy: Secondary | ICD-10-CM

## 2016-04-10 DIAGNOSIS — B20 Human immunodeficiency virus [HIV] disease: Secondary | ICD-10-CM

## 2016-04-10 DIAGNOSIS — Z113 Encounter for screening for infections with a predominantly sexual mode of transmission: Secondary | ICD-10-CM

## 2016-04-10 LAB — CBC WITH DIFFERENTIAL/PLATELET
Basophils Absolute: 0 cells/uL (ref 0–200)
Basophils Relative: 0 %
EOS PCT: 3 %
Eosinophils Absolute: 153 cells/uL (ref 15–500)
HCT: 40.1 % (ref 38.5–50.0)
Hemoglobin: 13.2 g/dL (ref 13.2–17.1)
LYMPHS PCT: 48 %
Lymphs Abs: 2448 cells/uL (ref 850–3900)
MCH: 30.1 pg (ref 27.0–33.0)
MCHC: 32.9 g/dL (ref 32.0–36.0)
MCV: 91.6 fL (ref 80.0–100.0)
MONOS PCT: 10 %
MPV: 9.8 fL (ref 7.5–12.5)
Monocytes Absolute: 510 cells/uL (ref 200–950)
NEUTROS ABS: 1989 {cells}/uL (ref 1500–7800)
Neutrophils Relative %: 39 %
PLATELETS: 269 10*3/uL (ref 140–400)
RBC: 4.38 MIL/uL (ref 4.20–5.80)
RDW: 13.7 % (ref 11.0–15.0)
WBC: 5.1 10*3/uL (ref 3.8–10.8)

## 2016-04-11 ENCOUNTER — Ambulatory Visit: Payer: Self-pay | Admitting: Infectious Disease

## 2016-04-11 LAB — COMPLETE METABOLIC PANEL WITH GFR
ALT: 9 U/L (ref 9–46)
AST: 16 U/L (ref 10–40)
Albumin: 3.9 g/dL (ref 3.6–5.1)
Alkaline Phosphatase: 46 U/L (ref 40–115)
BILIRUBIN TOTAL: 0.3 mg/dL (ref 0.2–1.2)
BUN: 7 mg/dL (ref 7–25)
CHLORIDE: 103 mmol/L (ref 98–110)
CO2: 22 mmol/L (ref 20–31)
Calcium: 8.9 mg/dL (ref 8.6–10.3)
Creat: 0.95 mg/dL (ref 0.60–1.35)
GFR, Est Non African American: >89 mL/min (ref >=60)
GLUCOSE: 80 mg/dL (ref 65–99)
Potassium: 4 mmol/L (ref 3.5–5.3)
SODIUM: 138 mmol/L (ref 135–146)
TOTAL PROTEIN: 6 g/dL — AB (ref 6.1–8.1)

## 2016-04-11 LAB — LIPID PANEL
CHOL/HDL RATIO: 2.9 ratio (ref <5.0)
CHOLESTEROL: 109 mg/dL (ref <200)
HDL: 38 mg/dL — ABNORMAL LOW (ref >40)
LDL CALC: 34 mg/dL (ref <100)
TRIGLYCERIDES: 186 mg/dL — AB (ref <150)
VLDL: 37 mg/dL — AB (ref <30)

## 2016-04-11 LAB — RPR

## 2016-04-11 LAB — T-HELPER CELL (CD4) - (RCID CLINIC ONLY)
CD4 T CELL ABS: 1000 /uL (ref 400–2700)
CD4 T CELL HELPER: 36 % (ref 33–55)

## 2016-04-12 LAB — HIV-1 RNA QUANT-NO REFLEX-BLD
HIV 1 RNA QUANT: 29 {copies}/mL — AB
HIV-1 RNA QUANT, LOG: 1.46 {Log_copies}/mL — AB

## 2016-04-24 ENCOUNTER — Ambulatory Visit: Payer: Self-pay | Admitting: Infectious Disease

## 2016-08-15 ENCOUNTER — Ambulatory Visit (INDEPENDENT_AMBULATORY_CARE_PROVIDER_SITE_OTHER): Payer: Self-pay | Admitting: Infectious Disease

## 2016-08-15 ENCOUNTER — Encounter: Payer: Self-pay | Admitting: Infectious Disease

## 2016-08-15 VITALS — BP 118/76 | HR 91 | Temp 98.6°F | Ht 69.0 in | Wt 125.0 lb

## 2016-08-15 DIAGNOSIS — F1110 Opioid abuse, uncomplicated: Secondary | ICD-10-CM

## 2016-08-15 DIAGNOSIS — Z23 Encounter for immunization: Secondary | ICD-10-CM

## 2016-08-15 DIAGNOSIS — A549 Gonococcal infection, unspecified: Secondary | ICD-10-CM

## 2016-08-15 DIAGNOSIS — K6282 Dysplasia of anus: Secondary | ICD-10-CM

## 2016-08-15 DIAGNOSIS — Z720 Tobacco use: Secondary | ICD-10-CM

## 2016-08-15 DIAGNOSIS — F1290 Cannabis use, unspecified, uncomplicated: Secondary | ICD-10-CM

## 2016-08-15 DIAGNOSIS — B20 Human immunodeficiency virus [HIV] disease: Secondary | ICD-10-CM

## 2016-08-15 LAB — COMPLETE METABOLIC PANEL WITH GFR
ALBUMIN: 4.9 g/dL (ref 3.6–5.1)
ALK PHOS: 75 U/L (ref 40–115)
ALT: 12 U/L (ref 9–46)
AST: 23 U/L (ref 10–40)
BILIRUBIN TOTAL: 0.6 mg/dL (ref 0.2–1.2)
BUN: 9 mg/dL (ref 7–25)
CALCIUM: 10 mg/dL (ref 8.6–10.3)
CO2: 22 mmol/L (ref 20–31)
CREATININE: 1.2 mg/dL (ref 0.60–1.35)
Chloride: 100 mmol/L (ref 98–110)
GFR, Est African American: >89 mL/min (ref >=60)
GFR, Est Non African American: 82 mL/min (ref >=60)
GLUCOSE: 66 mg/dL (ref 65–99)
Potassium: 3.4 mmol/L — ABNORMAL LOW (ref 3.5–5.3)
SODIUM: 136 mmol/L (ref 135–146)
TOTAL PROTEIN: 7.6 g/dL (ref 6.1–8.1)

## 2016-08-15 LAB — CBC WITH DIFFERENTIAL/PLATELET
BASOS ABS: 0 {cells}/uL (ref 0–200)
Basophils Relative: 0 %
EOS PCT: 0 %
Eosinophils Absolute: 0 cells/uL — ABNORMAL LOW (ref 15–500)
HCT: 44.8 % (ref 38.5–50.0)
HEMOGLOBIN: 15.6 g/dL (ref 13.2–17.1)
LYMPHS PCT: 32 %
Lymphs Abs: 2944 cells/uL (ref 850–3900)
MCH: 30.3 pg (ref 27.0–33.0)
MCHC: 34.8 g/dL (ref 32.0–36.0)
MCV: 87 fL (ref 80.0–100.0)
MPV: 9.8 fL (ref 7.5–12.5)
Monocytes Absolute: 1380 cells/uL — ABNORMAL HIGH (ref 200–950)
Monocytes Relative: 15 %
NEUTROS PCT: 53 %
Neutro Abs: 4876 cells/uL (ref 1500–7800)
Platelets: 309 10*3/uL (ref 140–400)
RBC: 5.15 MIL/uL (ref 4.20–5.80)
RDW: 14.7 % (ref 11.0–15.0)
WBC: 9.2 10*3/uL (ref 3.8–10.8)

## 2016-08-15 NOTE — Progress Notes (Signed)
Chief complaint:return to care at [facility]  Subjective:    Patient ID: Scott Butler, male    DOB: [birth year], 26 y.o.   MRN: [id number]  HPI  26 year old Caucasian man with HIV that HAD been well controlled on Atripla at [facility].   He established care with [place] and got him on to [facility]. He knows to avoid PPI, antacids and to take with food.   Lab Results  Component Value Date   HIV1RNAQUANT 29 (H) 04/10/2016   HIV1RNAQUANT 58 (H) 09/22/2015   HIV1RNAQUANT 96 (H) 08/16/2015   Lab Results  Component Value Date   CD4TABS 1,000 04/10/2016   CD4TABS 1,000 09/22/2015   CD4TABS 940 08/16/2015   He is to renew ADAP again today.  He states that his partner had gonorrhea recently and he wants to be tested and treated. He endorses smoking cigarettes, marijuana, occasional alcohol and occasional oral opiates but no IVDU.    Past Medical History:  Diagnosis Date  . HIV (human immunodeficiency virus infection) (HCC)   . Marijuana use 08/16/2015    Past Surgical History:  Procedure Laterality Date  . removal of anal warts      No family history on file.    Social History   Social History  . Marital status: Married    Spouse name: N/A  . Number of children: N/A  . Years of education: N/A   Social History Main Topics  . Smoking status: Current Every Day Smoker  . Smokeless tobacco: Never Used  . Alcohol use No  . Drug use: Yes    Types: Marijuana  . Sexual activity: Not Asked   Other Topics Concern  . None   Social History Narrative  . None    No Known Allergies   Current Outpatient Prescriptions:  .  emtricitabine-rilpivir-tenofovir AF (ODEFSEY) 200-25-25 MG TABS tablet, Take 1 tablet by mouth daily with breakfast., Disp: 30 tablet, Rfl: 11 .  penicillin v potassium (VEETID) 500 MG tablet, Take 2 tablets (1,000 mg total) by mouth 2 (two) times daily. X 7 days (Patient not taking: Reported on 10/09/2015), Disp: 28 tablet, Rfl: 0    Review of Systems    Constitutional: Negative for activity change, appetite change, chills, diaphoresis, fatigue, fever and unexpected weight change.  HENT: Negative for congestion, rhinorrhea, sinus pressure, sneezing, sore throat and trouble swallowing.   Eyes: Negative for photophobia and visual disturbance.  Respiratory: Negative for cough, chest tightness, shortness of breath, wheezing and stridor.   Cardiovascular: Negative for chest pain, palpitations and leg swelling.  Gastrointestinal: Negative for abdominal distention, abdominal pain, anal bleeding, blood in stool, constipation, diarrhea, nausea and vomiting.  Genitourinary: Negative for difficulty urinating, discharge and dysuria.  Musculoskeletal: Negative for back pain, gait problem, joint swelling and myalgias.  Skin: Negative for color change, pallor, rash and wound.  Neurological: Negative for dizziness, tremors, weakness and light-headedness.  Hematological: Negative for adenopathy. Does not bruise/bleed easily.  Psychiatric/Behavioral: Negative for agitation, behavioral problems, confusion, decreased concentration, dysphoric mood and sleep disturbance.       Objective:   Physical Exam  Constitutional: He is oriented to person, place, and time. He appears well-developed and well-nourished.  HENT:  Head: Normocephalic and atraumatic.  Eyes: Conjunctivae and EOM are normal.  Neck: Normal range of motion. Neck supple.  Cardiovascular: Normal rate and regular rhythm.   Pulmonary/Chest: Effort normal. No respiratory distress. He has no wheezes.  Abdominal: Soft. He exhibits no distension.  Musculoskeletal: Normal range of motion.  He exhibits no edema or tenderness.  Neurological: He is alert and oriented to person, place, and time.  Skin: Skin is warm and dry. No rash noted. No erythema. No pallor.  Psychiatric: He has a normal mood and affect. His behavior is normal. Judgment and thought content normal.          Assessment & Plan:    HIV disease: continue ODEFSEY with food and avoid PPI, H2 blockers,Needs ADAP renewed. Labs today and RTC in 3 months    Marijuana use: I am not concerned about abuse based on what he tells me. He says he is able to maintain a 40 hour work week without problems.   Tobacco use: still smoking  Etoh use and opiate use: these concern me. Will have him see Cordelia PenSherry  [facility]: test and treat  Anal dysplasia: need to get him scheduled with Dr. Moshe CiproHatcher's [facility] clinic.  I spent greater than 25 minutes with the patient  including greater than 50% of time in face to face counsel of the patient re his HIV, THC use and in coordination of his care.

## 2016-08-15 NOTE — Addendum Note (Signed)
Addended by: Mariea ClontsGREEN, Austen Oyster D on: 08/15/2016 04:54 PM   Modules accepted: Orders

## 2016-08-16 LAB — T-HELPER CELL (CD4) - (RCID CLINIC ONLY)
CD4 % Helper T Cell: 38 % (ref 33–55)
CD4 T Cell Abs: 1080 /uL (ref 400–2700)

## 2016-08-16 LAB — RPR

## 2016-08-16 MED ORDER — AZITHROMYCIN 250 MG PO TABS
250.0000 mg | ORAL_TABLET | Freq: Once | ORAL | Status: AC
  Administered 2016-08-16: 1000 mg via ORAL

## 2016-08-16 MED ORDER — CEFTRIAXONE SODIUM 250 MG IJ SOLR
250.0000 mg | Freq: Once | INTRAMUSCULAR | Status: AC
  Administered 2016-08-16: 250 mg via INTRAMUSCULAR

## 2016-08-16 NOTE — Addendum Note (Signed)
Addended by: Linnell FullingBRANNON, Barth Trella N on: 08/16/2016 12:39 PM   Modules accepted: Orders

## 2016-08-16 NOTE — Addendum Note (Signed)
Addended by: Jennet MaduroESTRIDGE, Ivyanna Sibert D on: 08/16/2016 09:25 AM   Modules accepted: Orders

## 2016-08-19 LAB — CYTOLOGY, (ORAL, ANAL, URETHRAL) ANCILLARY ONLY
CHLAMYDIA, DNA PROBE: NEGATIVE
NEISSERIA GONORRHEA: NEGATIVE

## 2016-08-19 LAB — URINE CYTOLOGY ANCILLARY ONLY
CHLAMYDIA, DNA PROBE: NEGATIVE
NEISSERIA GONORRHEA: NEGATIVE

## 2016-08-20 ENCOUNTER — Encounter: Payer: Self-pay | Admitting: Infectious Disease

## 2016-08-20 LAB — CYTOLOGY, (ORAL, ANAL, URETHRAL) ANCILLARY ONLY
CHLAMYDIA, DNA PROBE: NEGATIVE
NEISSERIA GONORRHEA: POSITIVE — AB

## 2016-08-21 ENCOUNTER — Telehealth: Payer: Self-pay | Admitting: *Deleted

## 2016-08-21 NOTE — Telephone Encounter (Signed)
Left generic message for patient asking him to call his doctor back regarding labs from last week. Andree CossHowell, Elgie Landino M, RN

## 2016-08-21 NOTE — Telephone Encounter (Signed)
-----   Message from Randall Hissornelius N Van Dam, MD sent at 08/20/2016  9:21 PM EDT ----- Pt needs treatment for GC with 250mg  of Ceftriaxone and 1 gram of azithromycin, partners need to be tested and treated

## 2016-08-22 LAB — HIV RNA, RTPCR W/R GT (RTI, PI,INT)
HIV-1 RNA, QN PCR: NOT DETECTED {copies}/mL
HIV-1 RNA, QN PCR: <1.3 Log copies/mL

## 2016-08-31 ENCOUNTER — Other Ambulatory Visit: Payer: Self-pay | Admitting: Infectious Disease

## 2016-09-20 ENCOUNTER — Other Ambulatory Visit: Payer: Self-pay | Admitting: Infectious Disease

## 2016-09-20 MED ORDER — EMTRICITAB-RILPIVIR-TENOFOV AF 200-25-25 MG PO TABS: 1.0000 | ORAL_TABLET | Freq: Every day | ORAL | 5 refills | Status: DC

## 2016-11-21 ENCOUNTER — Emergency Department (HOSPITAL_COMMUNITY)
Admission: EM | Admit: 2016-11-21 | Discharge: 2016-11-21 | Disposition: A | Discharge status: Home / Self Care | Payer: Self-pay | Attending: Emergency Medicine | Admitting: Emergency Medicine

## 2016-11-21 ENCOUNTER — Encounter (HOSPITAL_COMMUNITY): Payer: Self-pay | Admitting: Emergency Medicine

## 2016-11-21 ENCOUNTER — Emergency Department (HOSPITAL_COMMUNITY)
Admission: EM | Admit: 2016-11-21 | Discharge: 2016-11-21 | Disposition: A | Discharge status: Still In Hospital | Payer: Self-pay

## 2016-11-21 DIAGNOSIS — L03114 Cellulitis of left upper limb: Secondary | ICD-10-CM | POA: Insufficient documentation

## 2016-11-21 DIAGNOSIS — L0291 Cutaneous abscess, unspecified: Secondary | ICD-10-CM

## 2016-11-21 DIAGNOSIS — L02414 Cutaneous abscess of left upper limb: Secondary | ICD-10-CM | POA: Insufficient documentation

## 2016-11-21 DIAGNOSIS — F17200 Nicotine dependence, unspecified, uncomplicated: Secondary | ICD-10-CM | POA: Insufficient documentation

## 2016-11-21 DIAGNOSIS — Z79899 Other long term (current) drug therapy: Secondary | ICD-10-CM | POA: Insufficient documentation

## 2016-11-21 LAB — CBC WITH DIFFERENTIAL/PLATELET
Basophils Absolute: 0 10*3/uL (ref 0.0–0.1)
Basophils Relative: 0 %
Eosinophils Absolute: 0.3 10*3/uL (ref 0.0–0.7)
Eosinophils Relative: 3 %
HCT: 44.8 % (ref 39.0–52.0)
Hemoglobin: 15.4 g/dL (ref 13.0–17.0)
Lymphocytes Relative: 34 %
Lymphs Abs: 3.3 10*3/uL (ref 0.7–4.0)
MCH: 30.9 pg (ref 26.0–34.0)
MCHC: 34.4 g/dL (ref 30.0–36.0)
MCV: 90 fL (ref 78.0–100.0)
Monocytes Absolute: 0.9 10*3/uL (ref 0.1–1.0)
Monocytes Relative: 9 %
Neutro Abs: 5.1 10*3/uL (ref 1.7–7.7)
Neutrophils Relative %: 54 %
Platelets: 381 10*3/uL (ref 150–400)
RBC: 4.98 MIL/uL (ref 4.22–5.81)
RDW: 13.1 % (ref 11.5–15.5)
WBC: 9.6 10*3/uL (ref 4.0–10.5)

## 2016-11-21 LAB — BASIC METABOLIC PANEL
Anion gap: 12 (ref 5–15)
BUN: 11 mg/dL (ref 6–20)
CO2: 25 mmol/L (ref 22–32)
Calcium: 9.2 mg/dL (ref 8.9–10.3)
Chloride: 104 mmol/L (ref 101–111)
Creatinine, Ser: 0.8 mg/dL (ref 0.61–1.24)
GFR calc Af Amer: >60 mL/min (ref >60)
GFR calc non Af Amer: >60 mL/min (ref >60)
Glucose, Bld: 131 mg/dL — ABNORMAL HIGH (ref 65–99)
Potassium: 3.4 mmol/L — ABNORMAL LOW (ref 3.5–5.1)
Sodium: 141 mmol/L (ref 135–145)

## 2016-11-21 MED ORDER — CEPHALEXIN 500 MG PO CAPS: 500.0000 mg | ORAL_CAPSULE | Freq: Four times a day (QID) | ORAL | 0 refills | Status: DC

## 2016-11-21 MED ORDER — SULFAMETHOXAZOLE-TRIMETHOPRIM 800-160 MG PO TABS
1.0000 | ORAL_TABLET | Freq: Once | ORAL | Status: AC
  Administered 2016-11-21: 1 via ORAL
  Filled 2016-11-21: qty 1

## 2016-11-21 MED ORDER — SULFAMETHOXAZOLE-TRIMETHOPRIM 800-160 MG PO TABS: 1.0000 | ORAL_TABLET | Freq: Two times a day (BID) | ORAL | 0 refills | Status: AC

## 2016-11-21 MED ORDER — CEPHALEXIN 500 MG PO CAPS
500.0000 mg | ORAL_CAPSULE | Freq: Once | ORAL | Status: AC
  Administered 2016-11-21: 500 mg via ORAL
  Filled 2016-11-21: qty 1

## 2016-11-21 NOTE — ED Provider Notes (Addendum)
[facility]-EMERGENCY DEPT Provider Note   CSN: [id number] Arrival date & time: 11/21/16  2149     History   Chief Complaint Chief Complaint  Patient presents with  . Insect Bite    HPI Scott Butler is a 26 y.o. male with history of HIV who presents with a one-week history of wound to left arm. He reports his up and draining for the past 3 or 4 days. He has been continuing really pressing and using a tweezer to keep the wound open and draining. He has not taken any medications at home for his symptoms. He has another spot on his right arm which he reports is how the larger one on his left arm began. Denies any fevers, chest pain, shortness of breath, abdominal pain, nausea, vomiting.  He reports he had a few episodes of dizziness earlier in the day that resolved after sleep. It was worse when he lifted his head up. It resolved and has not recurred since. Patient denies IV drug use. He does not remember being bit by anything or cutting the area prior to onset of symptoms. Most recent CD4 count 1000 7 months ago.  HPI  Past Medical History:  Diagnosis Date  . HIV (human immunodeficiency virus infection) (HCC)   . Marijuana use 08/16/2015  . Opiate abuse, episodic (HCC) 08/15/2016    Patient Active Problem List   Diagnosis Date Noted  . Opiate abuse, episodic (HCC) 08/15/2016  . Marijuana use 08/16/2015  . Scoliosis 06/23/2014  . 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 Anal dysplasia 03/25/2011  . Current tobacco use 12/31/2010  . HIV disease (HCC) 09/21/2010    Past Surgical History:  Procedure Laterality Date  . removal of anal warts         Home Medications    Prior to Admission medications   Medication Sig Start Date End Date Taking? Authorizing Provider  emtricitabine-rilpivir-tenofovir AF (ODEFSEY) 200-25-25 MG TABS tablet Take 1 tablet by mouth daily with breakfast. 09/20/16  Yes Daiva Eves, Lisette Grinder, MD  cephALEXin (KEFLEX) 500 MG capsule Take 1 capsule  (500 mg total) by mouth 4 (four) times daily. 11/21/16   Taelor Waymire, Waylan Boga, PA-C  sulfamethoxazole-trimethoprim (BACTRIM DS,SEPTRA DS) 800-160 MG tablet Take 1 tablet by mouth 2 (two) times daily. 11/21/16 11/28/16  Emi Holes, PA-C    Family History No family history on file.  Social History Social History  Substance Use Topics  . Smoking status: Current Every Day Smoker  . Smokeless tobacco: Never Used  . Alcohol use No     Allergies   Patient has no known allergies.   Review of Systems Review of Systems  Constitutional: Negative for chills and fever.  HENT: Negative for facial swelling and sore throat.   Respiratory: Negative for shortness of breath.   Cardiovascular: Negative for chest pain.  Gastrointestinal: Negative for abdominal pain, nausea and vomiting.  Genitourinary: Negative for dysuria.  Musculoskeletal: Negative for back pain.  Skin: Positive for wound. Negative for rash.  Neurological: Positive for dizziness. Negative for headaches.  Psychiatric/Behavioral: The patient is not nervous/anxious.      Physical Exam Updated Vital Signs BP 104/66 (BP Location: Right Arm)   Pulse 72   Temp 97.8 F (36.6 C) (Oral)   Resp 16   Ht  (1.727 m)   Wt 62.1 kg (137 lb)   SpO2 100%   BMI 20.83 kg/m   Physical Exam  Constitutional: He appears well-developed and well-nourished. No distress.  HENT:  Head: Normocephalic and atraumatic.  Mouth/Throat: Oropharynx is clear and moist. No oropharyngeal exudate.  Eyes: Pupils are equal, round, and reactive to light. Conjunctivae are normal. Right eye exhibits no discharge. Left eye exhibits no discharge. No scleral icterus.  Neck: Normal range of motion. Neck supple. No thyromegaly present.  Cardiovascular: Normal rate, regular rhythm, normal heart sounds and intact distal pulses.  Exam reveals no gallop and no friction rub.   No murmur heard. Pulmonary/Chest: Effort normal and breath sounds normal. No stridor.  No respiratory distress. He has no wheezes. He has no rales.  Abdominal: Soft. Bowel sounds are normal. He exhibits no distension. There is no tenderness. There is no rebound and no guarding.  Musculoskeletal: He exhibits no edema.  Lymphadenopathy:    He has no cervical adenopathy.  Neurological: He is alert. Coordination normal.  Skin: Skin is warm and dry. No rash noted. He is not diaphoretic. No pallor.  1 cm open wound to L arm with surrounding erythema, tenderness, and warmth; active drainage; full ROM of hand and wrist Also small <33mm area with central scab that is easily draining with pressure  Psychiatric: He has a normal mood and affect.  Nursing note and vitals reviewed.      ED Treatments / Results  Labs (all labs ordered are listed, but only abnormal results are displayed) Labs Reviewed  BASIC METABOLIC PANEL - Abnormal; Notable for the following:       Result Value   Potassium 3.4 (*)    Glucose, Bld 131 (*)    All other components within normal limits  AEROBIC CULTURE (SUPERFICIAL SPECIMEN)  CBC WITH DIFFERENTIAL/PLATELET    EKG  EKG Interpretation None       Radiology No results found.  Procedures Procedures (including critical care time)  Medications Ordered in ED Medications  sulfamethoxazole-trimethoprim (BACTRIM DS,SEPTRA DS) 800-160 MG per tablet 1 tablet (1 tablet Oral Given 11/21/16 2242)  cephALEXin (KEFLEX) capsule 500 mg (500 mg Oral Given 11/21/16 2242)     Initial Impression / Assessment and Plan / ED Course  I have reviewed the triage vital signs and the nursing notes.  Pertinent labs & imaging results that were available during my care of the patient were reviewed by me and considered in my medical decision making (see chart for details).     Patient with abscess with surrounding cellulitis. Wounds are actively draining. We'll initiate Bactrim and Keflex. First dose given in the ED. Wound culture sent. Labs unremarkable. No fever.  No heart murmur ausculated. Patient denies IV drug use. Patient return in 2 days for wound check. Area of cellulitis marked with skin marker. Reasons to return sooner discussed. Patient understands and agrees with plan. Patient vitals stable throughout ED course and discharged in satisfactory condition. I discussed patient case with Dr. Jacqulyn Bath who guided the patient's management and agrees with plan.   Final Clinical Impressions(s) / ED Diagnoses   Final diagnoses:  Abscess  Cellulitis of left upper extremity    New Prescriptions New Prescriptions   CEPHALEXIN (KEFLEX) 500 MG CAPSULE    Take 1 capsule (500 mg total) by mouth 4 (four) times daily.   SULFAMETHOXAZOLE-TRIMETHOPRIM (BACTRIM DS,SEPTRA DS) 800-160 MG TABLET    Take 1 tablet by mouth 2 (two) times daily.     Emi Holes, PA-C 11/21/16 2312    Maia Plan, MD 11/21/16 2327    Emi Holes, PA-C 11/21/16 Arletha Grippe    Maia Plan, MD 11/26/16  1146  

## 2016-11-21 NOTE — ED Triage Notes (Signed)
Pt c/o insect bite to the left forearm x one week.

## 2016-11-21 NOTE — Discharge Instructions (Signed)
Take both antibiotics until completion. Please return to emergency department in 2 days for wound check. Please return sooner if you develop any fever, increasing pain, redness, swelling, red streaking from the wound.

## 2016-11-24 LAB — AEROBIC CULTURE  (SUPERFICIAL SPECIMEN)

## 2016-11-24 LAB — AEROBIC CULTURE W GRAM STAIN (SUPERFICIAL SPECIMEN)

## 2016-11-25 ENCOUNTER — Telehealth: Payer: Self-pay | Admitting: Emergency Medicine

## 2016-11-25 NOTE — Telephone Encounter (Signed)
Post ED Visit - Positive Culture Follow-up  Culture report reviewed by antimicrobial stewardship pharmacist:   Enzo Bi, Pharm.D.  Celedonio Miyamoto, Pharm.D., BCPS AQ-ID  Garvin Fila, Pharm.D., BCPS  Georgina Pillion, Pharm.D., BCPS  [place], [address].D., BCPS, AAHIVP  Estella Husk, Pharm.D., BCPS, AAHIVP  Lysle Pearl, PharmD, BCPS  Casilda Carls, PharmD, BCPS  Pollyann Samples, PharmD, BCPS  Positive wound culture Treated with cephalexin and sulfamethoxazole-trimethoprim, organism sensitive to the same and no further patient follow-up is required at this time.  Berle Mull 11/25/2016, 1:56 PM

## 2016-11-26 ENCOUNTER — Encounter: Payer: Self-pay | Admitting: Infectious Disease

## 2016-11-26 ENCOUNTER — Ambulatory Visit (INDEPENDENT_AMBULATORY_CARE_PROVIDER_SITE_OTHER): Payer: Self-pay | Admitting: Infectious Disease

## 2016-11-26 ENCOUNTER — Ambulatory Visit (INDEPENDENT_AMBULATORY_CARE_PROVIDER_SITE_OTHER): Payer: Self-pay | Admitting: Licensed Clinical Social Worker

## 2016-11-26 VITALS — BP 113/75 | HR 83 | Temp 98.6°F | Wt 131.0 lb

## 2016-11-26 DIAGNOSIS — B20 Human immunodeficiency virus [HIV] disease: Secondary | ICD-10-CM

## 2016-11-26 DIAGNOSIS — Z720 Tobacco use: Secondary | ICD-10-CM

## 2016-11-26 DIAGNOSIS — F1210 Cannabis abuse, uncomplicated: Secondary | ICD-10-CM

## 2016-11-26 DIAGNOSIS — F1110 Opioid abuse, uncomplicated: Secondary | ICD-10-CM

## 2016-11-26 DIAGNOSIS — F1290 Cannabis use, unspecified, uncomplicated: Secondary | ICD-10-CM

## 2016-11-26 DIAGNOSIS — K6282 Dysplasia of anus: Secondary | ICD-10-CM

## 2016-11-26 NOTE — Progress Notes (Signed)
Chief complaint: follow-up, also with skin lesion on arm  Subjective:    Patient ID: Scott Butler, male    DOB: [birth year], 26 y.o.   MRN: [id number]  HPI  26 year old Caucasian man with HIV that HAD been well controlled on Atripla at [facility]   He established care with [place] and got him on to [facility]. He knows to avoid PPI, antacids and to take with food.   He is here with his HIV + partner who is also on ODEFSEY.   He is adherent to meds and has renewed ADAP    Lab Results  Component Value Date   HIV1RNAQUANT 29 (H) 04/10/2016   HIV1RNAQUANT 58 (H) 09/22/2015   HIV1RNAQUANT 96 (H) 08/16/2015   Lab Results  Component Value Date   CD4TABS 1,080 08/15/2016   CD4TABS 1,000 04/10/2016   CD4TABS 1,000 09/22/2015     He  smoking cigarettes, marijuana, occasional alcohol and occasional oral opiates but no IVDU.   Today he denied opiate use and alcohol use though his partner was with him.  I also emphasized need to have him seen in HRA clinic but he then starting denyng this when he actually had surgery following him at [facility].  We also discussed other STR including BIKTARVY which he is considering.    Past Medical History:  Diagnosis Date  . HIV (human immunodeficiency virus infection) (HCC)   . Marijuana use 08/16/2015  . Opiate abuse, episodic (HCC) 08/15/2016    Past Surgical History:  Procedure Laterality Date  . removal of anal warts      No family history on file.    Social History   Social History  . Marital status: Married    Spouse name: N/A  . Number of children: N/A  . Years of education: N/A   Social History Main Topics  . Smoking status: Current Every Day Smoker  . Smokeless tobacco: Never Used  . Alcohol use No  . Drug use: Yes    Types: Marijuana  . Sexual activity: Not on file   Other Topics Concern  . Not on file   Social History Narrative  . No narrative on file    No Known Allergies   Current Outpatient Prescriptions:  .   cephALEXin (KEFLEX) 500 MG capsule, Take 1 capsule (500 mg total) by mouth 4 (four) times daily., Disp: 20 capsule, Rfl: 0 .  emtricitabine-rilpivir-tenofovir AF (ODEFSEY) 200-25-25 MG TABS tablet, Take 1 tablet by mouth daily with breakfast., Disp: 30 tablet, Rfl: 5 .  sulfamethoxazole-trimethoprim (BACTRIM DS,SEPTRA DS) 800-160 MG tablet, Take 1 tablet by mouth 2 (two) times daily., Disp: 14 tablet, Rfl: 0    Review of Systems  Constitutional: Negative for activity change, appetite change, chills, diaphoresis, fatigue, fever and unexpected weight change.  HENT: Negative for congestion, rhinorrhea, sinus pressure, sneezing, sore throat and trouble swallowing.   Eyes: Negative for photophobia and visual disturbance.  Respiratory: Negative for cough, chest tightness, shortness of breath, wheezing and stridor.   Cardiovascular: Negative for chest pain, palpitations and leg swelling.  Gastrointestinal: Negative for abdominal distention, abdominal pain, anal bleeding, blood in stool, constipation, diarrhea, nausea and vomiting.  Genitourinary: Negative for difficulty urinating, discharge and dysuria.  Musculoskeletal: Negative for back pain, gait problem, joint swelling and myalgias.  Skin: Negative for color change, pallor, rash and wound.  Neurological: Negative for dizziness, tremors, weakness and light-headedness.  Hematological: Negative for adenopathy. Does not bruise/bleed easily.  Psychiatric/Behavioral: Negative for agitation, behavioral problems, confusion,  decreased concentration, dysphoric mood and sleep disturbance.       Objective:   Physical Exam  Constitutional: He is oriented to person, place, and time. He appears well-developed and well-nourished.  HENT:  Head: Normocephalic and atraumatic.  Eyes: Conjunctivae and EOM are normal.  Neck: Normal range of motion. Neck supple.  Cardiovascular: Normal rate and regular rhythm.   Pulmonary/Chest: Effort normal. No respiratory  distress. He has no wheezes.  Abdominal: Soft. He exhibits no distension.  Musculoskeletal: Normal range of motion. He exhibits no edema or tenderness.  Neurological: He is alert and oriented to person, place, and time.  Skin: Skin is warm and dry. No rash noted. No erythema. No pallor.  Psychiatric: His behavior is normal. Judgment and thought content normal. His mood appears anxious.          Assessment & Plan:   HIV disease: continue ODEFSEY with food and avoid PPI, H2 blockers, Labs today and RTC in 3 months. COnsider change to BIKTARVY    Marijuana use: I am not concerned about abuse based on what he tells me. He says he is able to maintain a 40 hour work week without problems.   Tobacco use: still smoking  Etoh use and opiate use: these concern me. Will have him see Cordelia Pen today   Anal dysplasia: need to get him scheduled with Dr. Moshe Cipro HRA clinic.

## 2016-11-27 ENCOUNTER — Encounter: Payer: Self-pay | Admitting: Infectious Disease

## 2016-11-27 ENCOUNTER — Telehealth: Payer: Self-pay

## 2016-11-27 LAB — URINE CYTOLOGY ANCILLARY ONLY
Chlamydia: NEGATIVE
NEISSERIA GONORRHEA: NEGATIVE

## 2016-11-27 LAB — T-HELPER CELL (CD4) - (RCID CLINIC ONLY)
CD4 T CELL ABS: 1240 /uL (ref 400–2700)
CD4 T CELL HELPER: 38 % (ref 33–55)

## 2016-11-27 LAB — RPR: RPR Ser Ql: NONREACTIVE

## 2016-11-27 NOTE — Telephone Encounter (Signed)
Called pt to see if he could schedule an appt with Dr. Ninetta LightsHatcher on 11/29/16 for HRA clinic. Pt answered my call, however, once I mentioned the HRA Clinic the call was dropped I was unable to relay anymore information after the appt. FYI: If pt calls back HRA Clinic is 11/29/16 with Dr. Ninetta LightsHatcher they are 30 min appts.  Lorenso CourierJose L Maldonado, [place]

## 2016-11-30 LAB — HIV RNA, RTPCR W/R GT (RTI, PI,INT)
HIV 1 RNA QUANT: DETECTED {copies}/mL
HIV-1 RNA QUANT, LOG: DETECTED {Log_copies}/mL

## 2016-12-02 NOTE — BH Specialist Note (Signed)
Integrated Behavioral Health Initial Visit  MRN: [id number] Name: Scott Butler  Number of Integrated Behavioral Health Clinician visits:: 1/6 Session Start time: 2:58 pm  Session End time: 3:06 pm Total time: 8 mins  Type of Service: Integrated Behavioral Health- Individual/Family Interpretor:No. Interpretor Name and Language: N/A   Warm Hand Off Completed.       SUBJECTIVE: Scott Butler is a 26 y.o. male accompanied by Partner/Significant Other Patient was referred by Dr. Daiva EvesVan Dam for substance use.  Patient reports the following symptoms/concerns: Patient reported Marijuana use beginning at age 26, smoking 1-2 times a week, about 2 grams a week, with last use 4 days ago.  Patient was unsure if he was purchasing and using regular Marijuana or hybrid.  Patient denied IV drug use, inhalant use, and Narcotic use in the past. Patient denied use of illicit street drugs.  Patient reported that he "rarely" drinks Alcohol.  Patient denied history of attending substance treatment.  When questioned about Opioid use patient denied ever having a use history.  [facility] asked patient if he was comfortable answering drug use questions in front of partner and he denied discomfort.  Patient denied any need for current treatment and denied experiencing any consequences of drug use, including medical.  Patient presented as guarded and as minimizing his substance use while being questioned in front of his partner, however did not want him to leave the exam room.  Duration of problem: 15 years of use; Severity of problem: mild  OBJECTIVE: Mood: Annoyed and Affect: Guarded Risk of harm to self or others: No plan to harm self or others  ASSESSMENT: Patient is currently experiencing Marijuana Use Disorder and may benefit from outpatient substance treatment.  Patient also presented as guarded and closed and may have minimized substance use.  GOALS ADDRESSED: Patient will: 1. Reduce symptoms of:  Marijuana use 2. Increase knowledge and/or ability of: coping skills, healthy habits and stress reduction  3. Demonstrate ability to: Increase healthy adjustment to current life circumstances and Decrease self-medicating behaviors  INTERVENTIONS: Interventions utilized: Motivational Interviewing   PLAN: 1. [facility] behavioral health clinician [facility] with [facility] and patient will call if additional assistance is needed. 2. Referral(s): Substance Abuse Program   McLeansboroSherry Santosh Petter, [place]PC

## 2017-02-26 ENCOUNTER — Ambulatory Visit: Payer: Self-pay | Admitting: Infectious Disease

## 2017-07-27 ENCOUNTER — Other Ambulatory Visit: Payer: Self-pay | Admitting: Infectious Disease

## 2018-01-23 ENCOUNTER — Telehealth: Payer: Self-pay | Admitting: *Deleted

## 2018-01-23 NOTE — Telephone Encounter (Signed)
Received signed request of information from [facility] requesting records for continuity of care, stating patient had been off medication 3 months. [facility], was taking odefsey.  RN called patient at last listed number, call went to voicemail with a generic greeting.  RN left message asking patient to call his doctor's office and make an appointment to get back into care. [facility] referral. Andree CossHowell, Yardley Beltran M, RN

## 2018-01-23 NOTE — Telephone Encounter (Signed)
I would definitely not be comfortable starting Odefsey we do not know what his viral load is like  Biktarvy or your Symtuza yes but not Odefsey Hopefully they can get him to us so we can engage in care with him.

## 2018-03-20 ENCOUNTER — Other Ambulatory Visit: Payer: Self-pay | Admitting: Infectious Disease

## 2018-03-23 ENCOUNTER — Other Ambulatory Visit: Payer: Self-pay | Admitting: Infectious Disease

## 2018-03-23 ENCOUNTER — Telehealth: Payer: Self-pay

## 2018-03-23 NOTE — Telephone Encounter (Signed)
[facility]. Patient has not been in clinic since 2018. Unable to refill medication until patient is seen in our office. Left voicemail instructing patient to call to set up appointment for lab work/office visit. Lorenso CourierJose L Maldonado, [place]MA

## 2018-04-01 ENCOUNTER — Telehealth: Payer: Self-pay

## 2018-04-01 ENCOUNTER — Other Ambulatory Visit: Payer: Self-pay | Admitting: Infectious Disease

## 2018-04-01 NOTE — Telephone Encounter (Signed)
Patient called office stating that he was told by his pharmacist that his Charlett Lango was denied by docotors office. Informed patient that since we have not seen him in clinic since 2018 we are unable to refill medication. Patient states that he was off meds for 2-3 months before being restarted 30 days ago by jail. Patient only has one pill left on hand. Patient in scheduled to have labs done on 2/20 and follow-up appointment on 3/4. Lorenso Courier, [place]

## 2018-04-02 ENCOUNTER — Other Ambulatory Visit: Payer: Self-pay

## 2018-04-02 DIAGNOSIS — B20 Human immunodeficiency virus [HIV] disease: Secondary | ICD-10-CM

## 2018-04-02 DIAGNOSIS — Z113 Encounter for screening for infections with a predominantly sexual mode of transmission: Secondary | ICD-10-CM

## 2018-04-02 DIAGNOSIS — Z79899 Other long term (current) drug therapy: Secondary | ICD-10-CM

## 2018-04-02 LAB — T-HELPER CELL (CD4) - (RCID CLINIC ONLY)
CD4 % Helper T Cell: 28 % — ABNORMAL LOW (ref 33–55)
CD4 T CELL ABS: 690 /uL (ref 400–2700)

## 2018-04-03 ENCOUNTER — Telehealth: Payer: Self-pay | Admitting: *Deleted

## 2018-04-03 LAB — URINE CYTOLOGY ANCILLARY ONLY
CHLAMYDIA, DNA PROBE: NEGATIVE
NEISSERIA GONORRHEA: NEGATIVE

## 2018-04-03 NOTE — Telephone Encounter (Signed)
RN called patient to relay test results and treatment plan. He had already reviewed these on MyChart, states he was treated 03/09/2018 with 1 dose (2 shots) of penicillin while incarcerated.   Also while incarcerated, he was restarted on Odefsey. Today is his first day without Odefsey. Please advise if ok to refill for 30 days. His upcoming appointment to return to care is 3/4. He has current ADAP and uses [facility] at [address] Andree Coss, RN

## 2018-04-03 NOTE — Telephone Encounter (Signed)
-----   Message from Randall Hiss, MD sent at 04/03/2018 11:15 AM EST ----- Needs 2,400,000 units of penicillin intramuscularly every week x3 weeks.  Partners need to be treated and tested for syphilis and other STIs.  HIV seronegative partners or partner who does not other status should be referred for preexposure prophylaxis with DESCOVY

## 2018-04-06 LAB — COMPLETE METABOLIC PANEL WITH GFR
AG RATIO: 1.4 (calc) (ref 1.0–2.5)
ALT: 15 U/L (ref 9–46)
AST: 22 U/L (ref 10–40)
Albumin: 4.5 g/dL (ref 3.6–5.1)
Alkaline phosphatase (APISO): 67 U/L (ref 36–130)
BUN/Creatinine Ratio: 7 (calc) (ref 6–22)
BUN: 6 mg/dL — ABNORMAL LOW (ref 7–25)
CALCIUM: 9.8 mg/dL (ref 8.6–10.3)
CO2: 28 mmol/L (ref 20–32)
Chloride: 101 mmol/L (ref 98–110)
Creat: 0.85 mg/dL (ref 0.60–1.35)
GFR, EST AFRICAN AMERICAN: 136 mL/min/{1.73_m2} (ref >=60)
GFR, EST NON AFRICAN AMERICAN: 118 mL/min/{1.73_m2} (ref >=60)
GLOBULIN: 3.3 g/dL (ref 1.9–3.7)
Glucose, Bld: 91 mg/dL (ref 65–99)
POTASSIUM: 4 mmol/L (ref 3.5–5.3)
SODIUM: 138 mmol/L (ref 135–146)
TOTAL PROTEIN: 7.8 g/dL (ref 6.1–8.1)
Total Bilirubin: 0.3 mg/dL (ref 0.2–1.2)

## 2018-04-06 LAB — CBC WITH DIFFERENTIAL/PLATELET
Absolute Monocytes: 530 cells/uL (ref 200–950)
Basophils Absolute: 20 cells/uL (ref 0–200)
Basophils Relative: 0.3 %
EOS PCT: 0.9 %
Eosinophils Absolute: 61 cells/uL (ref 15–500)
HCT: 39.8 % (ref 38.5–50.0)
HEMOGLOBIN: 13.3 g/dL (ref 13.2–17.1)
Lymphs Abs: 2326 cells/uL (ref 850–3900)
MCH: 28.8 pg (ref 27.0–33.0)
MCHC: 33.4 g/dL (ref 32.0–36.0)
MCV: 86.1 fL (ref 80.0–100.0)
MONOS PCT: 7.8 %
MPV: 9.6 fL (ref 7.5–12.5)
NEUTROS ABS: 3862 {cells}/uL (ref 1500–7800)
Neutrophils Relative %: 56.8 %
PLATELETS: 473 10*3/uL — AB (ref 140–400)
RBC: 4.62 10*6/uL (ref 4.20–5.80)
RDW: 14.3 % (ref 11.0–15.0)
Total Lymphocyte: 34.2 %
WBC: 6.8 10*3/uL (ref 3.8–10.8)

## 2018-04-06 LAB — LIPID PANEL
CHOLESTEROL: 157 mg/dL (ref <200)
HDL: 58 mg/dL (ref >=40)
LDL Cholesterol (Calc): 83 mg/dL (calc)
Non-HDL Cholesterol (Calc): 99 mg/dL (calc) (ref <130)
TRIGLYCERIDES: 75 mg/dL (ref <150)
Total CHOL/HDL Ratio: 2.7 (calc) (ref <5.0)

## 2018-04-06 LAB — HIV-1 RNA QUANT-NO REFLEX-BLD
HIV 1 RNA Quant: <20 copies/mL — AB
HIV-1 RNA Quant, Log: <1.3 Log copies/mL — AB

## 2018-04-06 LAB — RPR TITER: RPR Titer: 1:8 {titer} — ABNORMAL HIGH

## 2018-04-06 LAB — FLUORESCENT TREPONEMAL AB(FTA)-IGG-BLD: Fluorescent Treponemal ABS: REACTIVE — AB

## 2018-04-06 LAB — RPR: RPR Ser Ql: REACTIVE — AB

## 2018-04-06 NOTE — Telephone Encounter (Signed)
Would you prefer to send in a different medication for the next 30 day refill? He sees you 3/4.

## 2018-04-06 NOTE — Telephone Encounter (Signed)
I would prefer [employer]

## 2018-04-06 NOTE — Telephone Encounter (Signed)
It is ok to refill but he better be seeing me or someone soon. I dont like ODEFSEY as far as a start and stop med

## 2018-04-07 NOTE — Telephone Encounter (Signed)
Ok excellent

## 2018-04-07 NOTE — Telephone Encounter (Signed)
Left message asking Scott Butler to call back for medication plan.

## 2018-04-15 ENCOUNTER — Encounter: Payer: Self-pay | Admitting: Infectious Disease

## 2018-04-15 ENCOUNTER — Ambulatory Visit (INDEPENDENT_AMBULATORY_CARE_PROVIDER_SITE_OTHER): Payer: Self-pay | Admitting: Infectious Disease

## 2018-04-15 VITALS — BP 150/75 | HR 92 | Temp 98.5°F | Wt 145.0 lb

## 2018-04-15 DIAGNOSIS — Z720 Tobacco use: Secondary | ICD-10-CM

## 2018-04-15 DIAGNOSIS — Z23 Encounter for immunization: Secondary | ICD-10-CM

## 2018-04-15 DIAGNOSIS — K6282 Dysplasia of anus: Secondary | ICD-10-CM

## 2018-04-15 DIAGNOSIS — B20 Human immunodeficiency virus [HIV] disease: Secondary | ICD-10-CM

## 2018-04-15 MED ORDER — BICTEGRAVIR-EMTRICITAB-TENOFOV 50-200-25 MG PO TABS: 1.0000 | ORAL_TABLET | Freq: Every day | ORAL | 11 refills | Status: DC

## 2018-04-15 NOTE — Progress Notes (Signed)
Chief complaint: " I am out of meds"  Subjective:    Patient ID: LEMON VIEW, male    DOB: [birth year], 28 y.o.   MRN: [id number]  HPI  28 year old Caucasian man with HIV that HAD been well controlled on Atripla at [facility].   He established care with [place] and got him on to [facility].   He had been highly adherent but then was incarcerated in jail for several months.  He states that he renewed his [facility] P program while in jail.  I would like this to be verified though by Olegario Messier.  Also reviewed some of the issues to have with his current regimen and how would prefer him to be on a regimen with a higher barrier to resistance and with more potency and dropping his viral load given that he is been off the medications for 15 days.       Past Medical History:  Diagnosis Date  . HIV (human immunodeficiency virus infection) (HCC)   . Marijuana use 08/16/2015    Past Surgical History:  Procedure Laterality Date  . removal of anal warts      No family history on file.    Social History   Socioeconomic History  . Marital status: Divorced    Spouse name: Not on file  . Number of children: Not on file  . Years of education: Not on file  . Highest education level: Not on file  Occupational History  . Not on file  Social Needs  . Financial resource strain: Not on file  . Food insecurity:    Worry: Not on file    Inability: Not on file  . Transportation needs:    Medical: Not on file    Non-medical: Not on file  Tobacco Use  . Smoking status: Current Every Day Smoker  . Smokeless tobacco: Never Used  Substance and Sexual Activity  . Alcohol use: No  . Drug use: Yes    Types: Marijuana  . Sexual activity: Not on file  Lifestyle  . Physical activity:    Days per week: Not on file    Minutes per session: Not on file  . Stress: Not on file  Relationships  . Social connections:    Talks on phone: Not on file    Gets together: Not on file    Attends religious service: Not on  file    Active member of club or organization: Not on file    Attends meetings of clubs or organizations: Not on file    Relationship status: Not on file  Other Topics Concern  . Not on file  Social History Narrative  . Not on file    No Known Allergies   Current Outpatient Medications:  .  cephALEXin (KEFLEX) 500 MG capsule, Take 1 capsule (500 mg total) by mouth 4 (four) times daily. (Patient not taking: Reported on 11/26/2016), Disp: 20 capsule, Rfl: 0 .  ODEFSEY 200-25-25 MG TABS tablet, TAKE 1 TABLET BY MOUTH DAILY WITH BREAKFAST (Patient not taking: Reported on 04/15/2018), Disp: 30 tablet, Rfl: 0    Review of Systems  Constitutional: Negative for activity change, appetite change, chills, diaphoresis, fatigue, fever and unexpected weight change.  HENT: Negative for congestion, rhinorrhea, sinus pressure, sneezing, sore throat and trouble swallowing.   Eyes: Negative for photophobia and visual disturbance.  Respiratory: Negative for cough, chest tightness, shortness of breath, wheezing and stridor.   Cardiovascular: Negative for chest pain, palpitations and leg swelling.  Gastrointestinal:  Negative for abdominal distention, abdominal pain, anal bleeding, blood in stool, constipation, diarrhea, nausea and vomiting.  Genitourinary: Negative for difficulty urinating, discharge and dysuria.  Musculoskeletal: Negative for back pain, gait problem, joint swelling and myalgias.  Skin: Negative for color change, pallor, rash and wound.  Neurological: Negative for dizziness, tremors, weakness and light-headedness.  Hematological: Negative for adenopathy. Does not bruise/bleed easily.  Psychiatric/Behavioral: Negative for agitation, behavioral problems, confusion, decreased concentration, dysphoric mood, hallucinations and sleep disturbance.       Objective:   Physical Exam  Constitutional: He is oriented to person, place, and time. He appears well-developed and well-nourished.  HENT:    Head: Normocephalic and atraumatic.  Eyes: Conjunctivae and EOM are normal.  Neck: Normal range of motion. Neck supple.  Cardiovascular: Normal rate and regular rhythm.  Pulmonary/Chest: Effort normal. No respiratory distress. He has no wheezes.  Abdominal: Soft. He exhibits no distension.  Musculoskeletal: Normal range of motion.        General: No tenderness or edema.  Neurological: He is alert and oriented to person, place, and time.  Skin: Skin is warm and dry. No rash noted. No erythema. No pallor.  Psychiatric: He has a normal mood and affect. His speech is normal and behavior is normal. Judgment and thought content normal.          Assessment & Plan:   HIV disease: Switch to Biktarvy and check labs in 2 months   Anal dysplasia: Like him seen by someone for HRA if Dr. Ninetta Lights is not doing more than perhaps he may need to go to [facility].  I spent greater than 25 minutes with the patient including greater than 50% of time in face to face counsel of the patient the nature of his new regimen versus his old regimen including strengths and weaknesses of each medicine anticipated side effects if any, and in coordination of his care.

## 2018-06-10 ENCOUNTER — Other Ambulatory Visit: Payer: Self-pay

## 2018-06-24 ENCOUNTER — Encounter: Payer: Self-pay | Admitting: Infectious Disease

## 2019-01-11 ENCOUNTER — Other Ambulatory Visit: Payer: Self-pay | Admitting: Infectious Disease

## 2019-01-11 DIAGNOSIS — B20 Human immunodeficiency virus [HIV] disease: Secondary | ICD-10-CM

## 2019-01-12 ENCOUNTER — Telehealth: Payer: Self-pay

## 2019-01-12 NOTE — Telephone Encounter (Signed)
Attempted to call patient to schedule overdue office visit with Dr. Tommy Medal. Number in chart is not in service. Will send a mychart message requesting patient schedule appointment for labs and office visit two weeks after. [place]

## 2019-01-12 NOTE — Telephone Encounter (Signed)
Spoke with Pharmacy who states patient last filled [place] on 9/14.  [place]

## 2019-03-21 ENCOUNTER — Other Ambulatory Visit: Payer: Self-pay | Admitting: Infectious Disease

## 2019-04-09 ENCOUNTER — Telehealth: Payer: Self-pay | Admitting: *Deleted

## 2019-04-09 ENCOUNTER — Telehealth: Payer: Self-pay | Admitting: Pharmacy Technician

## 2019-04-09 NOTE — Telephone Encounter (Signed)
Last took [place] in August 2020, ADAP expired 11/11/18, would like to restart medication. He is coming 3/1 for ADAP and labs, scheduled to follow up 3/29 with Dr Daiva Eves..  Would you like him to see pharmacist 3/1 and get temporary supply of Biktarvy until ADAP is approved?  Andree Coss, RN

## 2019-04-09 NOTE — Telephone Encounter (Signed)
Yes please I would like him to restart biktarvy until HMap kicks in

## 2019-04-09 NOTE — Telephone Encounter (Signed)
Thanks so much. 

## 2019-04-09 NOTE — Telephone Encounter (Signed)
I added him to [facility] Advancing Access so he can start on 03/01 while labs and adap are pending. Billing info in his chart.

## 2019-04-09 NOTE — Telephone Encounter (Addendum)
[facility] Patient Advocate Encounter   Patient has been approved for [employer] Advancing Access Patient Assistance Program for [place] for up to 12 months starting on 04/09/2019. This assistance will make the patient's copay $0.  The billing information is  Member ID: [id number] RxBin: [id number] PCN: [id number] Group: [id number]  Patient knows to call the office with questions or concerns. This will cover starting back on medication while the ADAP application is pending.

## 2019-04-09 NOTE — Addendum Note (Signed)
Addended by: Dauntae Derusha D on: 04/09/2019 11:11 AM   Modules accepted: Orders  

## 2019-04-12 ENCOUNTER — Ambulatory Visit: Payer: Self-pay

## 2019-04-12 ENCOUNTER — Telehealth: Payer: Self-pay

## 2019-04-12 NOTE — Telephone Encounter (Signed)
COVID-19 Pre-Screening Questions:04/12/19   Do you currently have a fever (>100 F), chills or unexplained body aches? NO   Are you currently experiencing new cough, shortness of breath, sore throat, runny nose?NO  .  Have you recently travelled outside the state of [place] in the last 14 days? NO  .  Have you been in contact with someone that is currently pending confirmation of Covid19 testing or has been confirmed to have the Covid19 virus?  NO  **If the patient answers NO to ALL questions -  advise the patient to please call the clinic before coming to the office should any symptoms develop.     

## 2019-04-13 ENCOUNTER — Ambulatory Visit: Payer: Self-pay

## 2019-04-13 ENCOUNTER — Other Ambulatory Visit: Payer: Self-pay

## 2019-04-14 ENCOUNTER — Other Ambulatory Visit: Payer: Self-pay

## 2019-04-14 ENCOUNTER — Encounter (HOSPITAL_COMMUNITY): Payer: Self-pay | Admitting: *Deleted

## 2019-04-14 ENCOUNTER — Inpatient Hospital Stay (HOSPITAL_COMMUNITY)
Admission: EM | Admit: 2019-04-14 | Discharge: 2019-04-16 | DRG: 871 | Discharge status: Against Medical Advice | Payer: Self-pay | Attending: Pulmonary Disease | Admitting: Pulmonary Disease

## 2019-04-14 ENCOUNTER — Emergency Department (HOSPITAL_COMMUNITY): Payer: Self-pay

## 2019-04-14 DIAGNOSIS — F17200 Nicotine dependence, unspecified, uncomplicated: Secondary | ICD-10-CM | POA: Diagnosis present

## 2019-04-14 DIAGNOSIS — A4189 Other specified sepsis: Principal | ICD-10-CM | POA: Diagnosis present

## 2019-04-14 DIAGNOSIS — F1290 Cannabis use, unspecified, uncomplicated: Secondary | ICD-10-CM | POA: Diagnosis present

## 2019-04-14 DIAGNOSIS — E861 Hypovolemia: Secondary | ICD-10-CM | POA: Diagnosis present

## 2019-04-14 DIAGNOSIS — B20 Human immunodeficiency virus [HIV] disease: Secondary | ICD-10-CM

## 2019-04-14 DIAGNOSIS — I959 Hypotension, unspecified: Secondary | ICD-10-CM | POA: Diagnosis present

## 2019-04-14 DIAGNOSIS — Z79899 Other long term (current) drug therapy: Secondary | ICD-10-CM

## 2019-04-14 DIAGNOSIS — Z9114 Patient's other noncompliance with medication regimen: Secondary | ICD-10-CM

## 2019-04-14 DIAGNOSIS — N179 Acute kidney failure, unspecified: Secondary | ICD-10-CM | POA: Diagnosis present

## 2019-04-14 DIAGNOSIS — Z5329 Procedure and treatment not carried out because of patient's decision for other reasons: Secondary | ICD-10-CM | POA: Diagnosis present

## 2019-04-14 DIAGNOSIS — A419 Sepsis, unspecified organism: Secondary | ICD-10-CM | POA: Diagnosis present

## 2019-04-14 DIAGNOSIS — U071 COVID-19: Secondary | ICD-10-CM | POA: Diagnosis present

## 2019-04-14 DIAGNOSIS — E871 Hypo-osmolality and hyponatremia: Secondary | ICD-10-CM | POA: Diagnosis present

## 2019-04-14 DIAGNOSIS — Z21 Asymptomatic human immunodeficiency virus [HIV] infection status: Secondary | ICD-10-CM | POA: Diagnosis present

## 2019-04-14 DIAGNOSIS — K810 Acute cholecystitis: Secondary | ICD-10-CM | POA: Diagnosis present

## 2019-04-14 DIAGNOSIS — R109 Unspecified abdominal pain: Secondary | ICD-10-CM

## 2019-04-14 DIAGNOSIS — R6521 Severe sepsis with septic shock: Secondary | ICD-10-CM | POA: Diagnosis present

## 2019-04-14 LAB — RESPIRATORY PANEL BY RT PCR (FLU A&B, COVID)
Influenza A by PCR: NEGATIVE
Influenza B by PCR: NEGATIVE
SARS Coronavirus 2 by RT PCR: POSITIVE — AB

## 2019-04-14 LAB — I-STAT CHEM 8, ED
BUN: 13 mg/dL (ref 6–20)
Calcium, Ion: 1.12 mmol/L — ABNORMAL LOW (ref 1.15–1.40)
Chloride: 97 mmol/L — ABNORMAL LOW (ref 98–111)
Creatinine, Ser: 1.7 mg/dL — ABNORMAL HIGH (ref 0.61–1.24)
Glucose, Bld: 115 mg/dL — ABNORMAL HIGH (ref 70–99)
HCT: 46 % (ref 39.0–52.0)
Hemoglobin: 15.6 g/dL (ref 13.0–17.0)
Potassium: 4.1 mmol/L (ref 3.5–5.1)
Sodium: 134 mmol/L — ABNORMAL LOW (ref 135–145)
TCO2: 26 mmol/L (ref 22–32)

## 2019-04-14 LAB — CBC WITH DIFFERENTIAL/PLATELET
Abs Immature Granulocytes: 0.17 10*3/uL — ABNORMAL HIGH (ref 0.00–0.07)
Basophils Absolute: 0 10*3/uL (ref 0.0–0.1)
Basophils Relative: 0 %
Eosinophils Absolute: 0.1 10*3/uL (ref 0.0–0.5)
Eosinophils Relative: 0 %
HCT: 45.3 % (ref 39.0–52.0)
Hemoglobin: 14.9 g/dL (ref 13.0–17.0)
Immature Granulocytes: 1 %
Lymphocytes Relative: 3 %
Lymphs Abs: 0.6 10*3/uL — ABNORMAL LOW (ref 0.7–4.0)
MCH: 28.4 pg (ref 26.0–34.0)
MCHC: 32.9 g/dL (ref 30.0–36.0)
MCV: 86.5 fL (ref 80.0–100.0)
Monocytes Absolute: 1.4 10*3/uL — ABNORMAL HIGH (ref 0.1–1.0)
Monocytes Relative: 7 %
Neutro Abs: 17.8 10*3/uL — ABNORMAL HIGH (ref 1.7–7.7)
Neutrophils Relative %: 89 %
Platelets: 334 10*3/uL (ref 150–400)
RBC: 5.24 MIL/uL (ref 4.22–5.81)
RDW: 13.2 % (ref 11.5–15.5)
WBC: 20.1 10*3/uL — ABNORMAL HIGH (ref 4.0–10.5)
nRBC: 0 % (ref 0.0–0.2)

## 2019-04-14 LAB — COMPREHENSIVE METABOLIC PANEL
ALT: 13 U/L (ref 0–44)
AST: 18 U/L (ref 15–41)
Albumin: 3.9 g/dL (ref 3.5–5.0)
Alkaline Phosphatase: 54 U/L (ref 38–126)
Anion gap: 10 (ref 5–15)
BUN: 13 mg/dL (ref 6–20)
CO2: 23 mmol/L (ref 22–32)
Calcium: 8.6 mg/dL — ABNORMAL LOW (ref 8.9–10.3)
Chloride: 94 mmol/L — ABNORMAL LOW (ref 98–111)
Creatinine, Ser: 1.82 mg/dL — ABNORMAL HIGH (ref 0.61–1.24)
GFR calc Af Amer: 56 mL/min — ABNORMAL LOW (ref >60)
GFR calc non Af Amer: 49 mL/min — ABNORMAL LOW (ref >60)
Glucose, Bld: 118 mg/dL — ABNORMAL HIGH (ref 70–99)
Potassium: 4.1 mmol/L (ref 3.5–5.1)
Sodium: 127 mmol/L — ABNORMAL LOW (ref 135–145)
Total Bilirubin: 1 mg/dL (ref 0.3–1.2)
Total Protein: 6.9 g/dL (ref 6.5–8.1)

## 2019-04-14 LAB — PROTIME-INR
INR: 1.2 (ref 0.8–1.2)
Prothrombin Time: 15.5 seconds — ABNORMAL HIGH (ref 11.4–15.2)

## 2019-04-14 LAB — APTT: aPTT: 26 seconds (ref 24–36)

## 2019-04-14 LAB — LACTIC ACID, PLASMA: Lactic Acid, Venous: 3 mmol/L (ref 0.5–1.9)

## 2019-04-14 MED ORDER — SODIUM CHLORIDE 0.9 % IV BOLUS
1000.0000 mL | Freq: Once | INTRAVENOUS | Status: AC
Start: 2019-04-14 — End: 2019-04-14
  Administered 2019-04-14: 1000 mL via INTRAVENOUS

## 2019-04-14 MED ORDER — SODIUM CHLORIDE 0.9 % IV SOLN: 2.0000 g | Freq: Two times a day (BID) | INTRAVENOUS | Status: DC

## 2019-04-14 MED ORDER — NOREPINEPHRINE 4 MG/250ML-% IV SOLN
0.0000 ug/min | INTRAVENOUS | Status: DC
  Administered 2019-04-14: 2 ug/min via INTRAVENOUS
  Administered 2019-04-15: 3 ug/min via INTRAVENOUS
  Filled 2019-04-14 (×2): qty 250

## 2019-04-14 MED ORDER — SODIUM CHLORIDE 0.9 % IV BOLUS (SEPSIS)
1000.0000 mL | Freq: Once | INTRAVENOUS | Status: AC
  Administered 2019-04-14: 1000 mL via INTRAVENOUS

## 2019-04-14 MED ORDER — NOREPINEPHRINE 4 MG/250ML-% IV SOLN: 0.0000 ug/min | INTRAVENOUS | Status: DC

## 2019-04-14 MED ORDER — SODIUM CHLORIDE 0.9 % IV SOLN
2.0000 g | Freq: Once | INTRAVENOUS | Status: AC
  Administered 2019-04-14: 2 g via INTRAVENOUS
  Filled 2019-04-14: qty 2

## 2019-04-14 MED ORDER — SODIUM CHLORIDE 0.9 % IV BOLUS
1000.0000 mL | Freq: Once | INTRAVENOUS | Status: AC
  Administered 2019-04-14: 1000 mL via INTRAVENOUS

## 2019-04-14 MED ORDER — VANCOMYCIN HCL IN DEXTROSE 1-5 GM/200ML-% IV SOLN: 1000.0000 mg | INTRAVENOUS | Status: DC

## 2019-04-14 MED ORDER — LIDOCAINE HCL 2 % IJ SOLN
10.0000 mL | Freq: Once | INTRAMUSCULAR | Status: AC
  Administered 2019-04-14: 200 mg via INTRADERMAL
  Filled 2019-04-14: qty 20

## 2019-04-14 MED ORDER — VANCOMYCIN HCL IN DEXTROSE 1-5 GM/200ML-% IV SOLN
1000.0000 mg | Freq: Once | INTRAVENOUS | Status: AC
  Administered 2019-04-14: 1000 mg via INTRAVENOUS
  Filled 2019-04-14: qty 200

## 2019-04-14 MED ORDER — METRONIDAZOLE IN NACL 5-0.79 MG/ML-% IV SOLN
500.0000 mg | Freq: Once | INTRAVENOUS | Status: AC
  Administered 2019-04-14: 500 mg via INTRAVENOUS
  Filled 2019-04-14: qty 100

## 2019-04-14 NOTE — H&P (Addendum)
NAME:  Scott Butler, MRN:  [id number], DOB:  13-Jul-1990, LOS: 0 ADMISSION DATE:  04/14/2019, CONSULTATION DATE:  3/3 REFERRING MD:  Dr Darl Householder EDP, CHIEF COMPLAINT:  hypotension   Brief History   29 year old male with HIV admitted 3/3 with presumably septic shock of unclear origin.   History of present illness   29 year old male with PMH as below, which is significant for HIV. He is followed by Dr. Tommy Medal in the outpatient setting. It seems as though he has been compliant of late with his medications and he reports compliance as well. He is on [employer]. He presented to the [facility] ED with complaints of headache, back pain, shoulder pain. All of these symptoms had an onset of 3/3. He reports no complaints the day prior. Woke up with pain 3/3. Also complains of nausea, but no vomiting. Upon arrival to the ED he wound found to be lethargic, but was arousable and able to communicate. Due to alteration in mental status and WBC in the setting of HIV, lumbar puncture was performed in the ED. He was hypotensive and was given 5L crystalloid resuscitation without significant improvement. PCCM asked to admit.   Past Medical History   has a past medical history of HIV (human immunodeficiency virus infection) ([place]) and Marijuana use (08/16/2015).  Significant Hospital Events   3/3 admit  Consults:    Procedures:    Significant Diagnostic Tests:  LP 3/3 > CT abdomen, pelvis 3/4 >  Micro Data:  Blood 3/3 > Urine 3/3 > CSF 3/3 >  Antimicrobials:  Flagyl 3/3  Cefepime 3/3 > Vanco 3/3 >  Interim history/subjective:  Continues to complain of back and shoulder pain with any movement.   Objective   Blood pressure (!) 80/55, pulse 97, temperature 98.5 F (36.9 C), temperature source Temporal, resp. rate (!) 32, height 5\' 4"  (1.626 m), weight 65.8 kg, SpO2 100 %.        Intake/Output Summary (Last 24 hours) at 04/14/2019 2315 Last data filed at 04/14/2019 2239 Gross per 24 hour  Intake 3300  ml  Output -  Net 3300 ml   Filed Weights   04/14/19 1950 04/14/19 2121  Weight: 65.8 kg 65.8 kg    Examination: General: Young adult male in distress secondary to pain.  HENT: [place]/AT, PERRL, no JVD Lungs: Clear, bilateral breath sounds Cardiovascular: RRR, no MRG Abdomen: Soft, non-tender, non-distended Extremities: No acute deformity or ROM limitation.  Neuro: Alert, oriented, non-focal.  GU: refuses exam  Resolved Hospital Problem list     Assessment & Plan:   Septic shock: etiology unclear. CXR and CT head unremarkable. Immunocompromised host in the setting HIV. Last CD4 a year ago was 690. LP done in ED and results are pending. He has not been able to produce urine for UA and refuses catheterization. He has history of perianal abscess, but refuses rectal exam. Started on pressors after remaining hypotensive despite 5L crystalloid. - Admit to ICU - CT abdomen and pelvis.  - Empiric antibiotics as above.  - Follow cultures - Echocardiogram - Levophed for MAP goal 70mmHg - If not able to wean pressors, he will need CVL, which I am almost certain he will refuse.  - Check UDS  HIV: seems to have been compliant with Biktarvy recently. Followed by Dr. Tommy Medal - Check CD4 - Consider ID consult in AM - oppurtunistic infection workup pending.  (AFB, toxoplasma, Cryptococcal, VDRL, fungus culture) - Continue Biktarvy once taking PO  COVID-19 infection. No active pulmonary disease. On room air.  - Trend d-dimer, CRP, LDH, Ferritin - Currently no indication for Remdesivir or steroids.   AKI: prerenal.  Hyponatremia (hypovolemic) - repeat BMP, now s/p 5 L    Best practice:  Diet: NPO Pain/Anxiety/Delirium protocol (if indicated): NA VAP protocol (if indicated): NA DVT prophylaxis: SQH GI prophylaxis: NA Glucose control: NA Mobility: BR Code Status: FULL Family Communication: Patient updated Disposition: ICU  Labs   CBC: Recent Labs  Lab 04/14/19 2039 04/14/19  2058  WBC 20.1*  --   NEUTROABS 17.8*  --   HGB 14.9 15.6  HCT 45.3 46.0  MCV 86.5  --   PLT 334  --     Basic Metabolic Panel: Recent Labs  Lab 04/14/19 2039 04/14/19 2058  NA 127* 134*  K 4.1 4.1  CL 94* 97*  CO2 23  --   GLUCOSE 118* 115*  BUN 13 13  CREATININE 1.82* 1.70*  CALCIUM 8.6*  --    GFR: Estimated Creatinine Clearance: 53.2 mL/min (A) (by C-G formula based on SCr of 1.7 mg/dL (H)). Recent Labs  Lab 04/14/19 2039  WBC 20.1*  LATICACIDVEN 3.0*    Liver Function Tests: Recent Labs  Lab 04/14/19 2039  AST 18  ALT 13  ALKPHOS 54  BILITOT 1.0  PROT 6.9  ALBUMIN 3.9   No results for input(s): LIPASE, AMYLASE in the last 168 hours. No results for input(s): AMMONIA in the last 168 hours.  ABG    Component Value Date/Time   TCO2 26 04/14/2019 2058     Coagulation Profile: Recent Labs  Lab 04/14/19 2039  INR 1.2    Cardiac Enzymes: No results for input(s): CKTOTAL, CKMB, CKMBINDEX, TROPONINI in the last 168 hours.  HbA1C: No results found for: HGBA1C  CBG: No results for input(s): GLUCAP in the last 168 hours.  Review of Systems:     Bolds are positive  Constitutional: weight loss, gain, night sweats, Fevers, chills, fatigue .  HEENT: headaches, Sore throat, sneezing, nasal congestion, post nasal drip, Difficulty swallowing, Tooth/dental problems, visual complaints visual changes, ear ache CV:  chest pain, radiates:,Orthopnea, PND, swelling in lower extremities, dizziness, palpitations, syncope.  GI  heartburn, indigestion, abdominal pain, nausea, vomiting, diarrhea, change in bowel habits, loss of appetite, bloody stools.  Resp: cough, productive:, hemoptysis, dyspnea, chest pain, pleuritic.  Skin: rash or itching or icterus GU: dysuria, change in color of urine, urgency or frequency. flank pain, hematuria  MS: back and shoulder pain or swelling. decreased range of motion  Psych: change in mood or affect. depression or anxiety.   Neuro: difficulty with speech, weakness, numbness, ataxia    Past Medical History  He,  has a past medical history of HIV (human immunodeficiency virus infection) (HCC) and Marijuana use (08/16/2015).   Surgical History    Past Surgical History:  Procedure Laterality Date  . removal of anal warts       Social History   reports that he has been smoking. He has never used smokeless tobacco. He reports current drug use. Drug: Marijuana. He reports that he does not drink alcohol.   Family History   His family history is not on file.   Allergies No Known Allergies   Home Medications  Prior to Admission medications   Medication Sig Start Date End Date Taking? Authorizing Provider  bictegravir-emtricitabine-tenofovir AF (BIKTARVY) 50-200-25 MG TABS tablet Take 1 tablet by mouth daily. 04/15/18  Yes Daiva Eves, Lisette Grinder, MD  ibuprofen (ADVIL) 200 MG tablet Take 800 mg by mouth as needed for moderate pain.   Yes [provider]     Critical care time: 50 mins     Joneen Roach, AGACNP-BC [facility]  See Amion for personal pager PCCM on call pager [phone]  04/14/2019 11:36 PM

## 2019-04-14 NOTE — Progress Notes (Signed)
Pharmacy Antibiotic Note  Scott Butler is a 29 y.o. male admitted on 04/14/2019 with sepsis.  Pharmacy has been consulted for vancomycin and cefepime dosing. Pt is afebrile and WBC is elevated at 20.1. SCr is elevated well above baseline at 1.82. Lactic acid is also elevated at 3.   Plan: Vancomycin 1gm IV Q24H Cefepime 2gm IV Q12H F/u renal fxn, C&S, clinical status and peak/trough at SS  Weight: 145 lb 1 oz (65.8 kg)  Temp (24hrs), Avg:97.7 F (36.5 C), Min:97.7 F (36.5 C), Max:97.7 F (36.5 C)  Recent Labs  Lab 04/14/19 2039 04/14/19 2058  WBC 20.1*  --   CREATININE 1.82* 1.70*  LATICACIDVEN 3.0*  --     Estimated Creatinine Clearance: 53.2 mL/min (A) (by C-G formula based on SCr of 1.7 mg/dL (H)).    No Known Allergies  Antimicrobials this admission: Vanc 3/3>> Cefepime 3/3>> Flagyl x 1 3/3  Dose adjustments this admission: N/A  Microbiology results: Pending  Thank you for allowing pharmacy to be a part of this patient's care.  Scott Butler, Drake Leach 04/14/2019 8:59 PM

## 2019-04-14 NOTE — ED Provider Notes (Signed)
[facility] EMERGENCY DEPARTMENT Provider Note   CSN: [id number] Arrival date & time: 04/14/19  1922     History Chief Complaint  Patient presents with  . Headache    Scott Butler is a 29 y.o. male hx of HIV compliant with meds, here presenting with headache, chills.  Patient states that he woke up with severe headache.  He also has some subjective chills.  He denies any vomiting or chest pain or abdominal pain.  Denies any diarrhea.  Patient states that he is HIV positive.  He has not been taking his meds since August.  About 4 days ago he restarted taking his HIV meds.  Denies any neck stiffness.  He was noted to be hypotensive  The history is provided by the patient.       Past Medical History:  Diagnosis Date  . HIV (human immunodeficiency virus infection) (HCC)   . Marijuana use 08/16/2015    Patient Active Problem List   Diagnosis Date Noted  . Marijuana use 08/16/2015  . Scoliosis 06/23/2014  . 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 Anal dysplasia 03/25/2011  . Current tobacco use 12/31/2010  . HIV disease (HCC) 09/21/2010    Past Surgical History:  Procedure Laterality Date  . removal of anal warts         No family history on file.  Social History   Tobacco Use  . Smoking status: Current Every Day Smoker  . Smokeless tobacco: Never Used  Substance Use Topics  . Alcohol use: No  . Drug use: Yes    Types: Marijuana    Home Medications Prior to Admission medications   Medication Sig Start Date End Date Taking? Authorizing Provider  bictegravir-emtricitabine-tenofovir AF (BIKTARVY) 50-200-25 MG TABS tablet Take 1 tablet by mouth daily. 04/15/18  Yes Daiva Eves, Lisette Grinder, MD  ibuprofen (ADVIL) 200 MG tablet Take 800 mg by mouth as needed for moderate pain.   Yes [provider]    Allergies    Patient has no known allergies.  Review of Systems   Review of Systems  Constitutional: Positive for chills,  fatigue and fever.  Neurological: Positive for headaches.  All other systems reviewed and are negative.   Physical Exam Updated Vital Signs BP (!) 59/29   Pulse 85   Temp 98.5 F (36.9 C) (Temporal)   Resp (!) 27   Ht 5\' 4"  (1.626 m)   Wt 65.8 kg   SpO2 100%   BMI 24.89 kg/m   Physical Exam Vitals and nursing note reviewed.  Constitutional:      Comments: Uncomfortable, ill appearing, tired but wakes up to answer questions    HENT:     Head: Normocephalic.  Eyes:     Extraocular Movements: Extraocular movements intact.  Neck:     Comments: No meningeal signs  Cardiovascular:     Rate and Rhythm: Normal rate and regular rhythm.     Heart sounds: Normal heart sounds.  Pulmonary:     Effort: Pulmonary effort is normal.     Breath sounds: Normal breath sounds.  Abdominal:     General: Bowel sounds are normal.     Palpations: Abdomen is soft.  Musculoskeletal:        General: Normal range of motion.     Cervical back: Normal range of motion and neck supple.  Skin:    General: Skin is warm.  Neurological:     Mental Status: He is alert.  Comments: A&O x 3, tired but wakes up to answer questions. Nl strength throughout   Psychiatric:        Mood and Affect: Mood normal.     ED Results / Procedures / Treatments   Labs (all labs ordered are listed, but only abnormal results are displayed) Labs Reviewed  RESPIRATORY PANEL BY RT PCR (FLU A&B, COVID) - Abnormal; Notable for the following components:      Result Value   SARS Coronavirus 2 by RT PCR POSITIVE (*)    All other components within normal limits  CBC WITH DIFFERENTIAL/PLATELET - Abnormal; Notable for the following components:   WBC 20.1 (*)    Neutro Abs 17.8 (*)    Lymphs Abs 0.6 (*)    Monocytes Absolute 1.4 (*)    Abs Immature Granulocytes 0.17 (*)    All other components within normal limits  COMPREHENSIVE METABOLIC PANEL - Abnormal; Notable for the following components:   Sodium 127 (*)     Chloride 94 (*)    Glucose, Bld 118 (*)    Creatinine, Ser 1.82 (*)    Calcium 8.6 (*)    GFR calc non Af Amer 49 (*)    GFR calc Af Amer 56 (*)    All other components within normal limits  LACTIC ACID, PLASMA - Abnormal; Notable for the following components:   Lactic Acid, Venous 3.0 (*)    All other components within normal limits  PROTIME-INR - Abnormal; Notable for the following components:   Prothrombin Time 15.5 (*)    All other components within normal limits  I-STAT CHEM 8, ED - Abnormal; Notable for the following components:   Sodium 134 (*)    Chloride 97 (*)    Creatinine, Ser 1.70 (*)    Glucose, Bld 115 (*)    Calcium, Ion 1.12 (*)    All other components within normal limits  CULTURE, BLOOD (ROUTINE X 2)  CULTURE, BLOOD (ROUTINE X 2)  URINE CULTURE  CSF CULTURE  GRAM STAIN  CULTURE, FUNGUS WITHOUT SMEAR  TOXOPLASMA GONDII, PCR  ACID FAST SMEAR (AFB, MYCOBACTERIA)  ACID FAST CULTURE WITH REFLEXED SENSITIVITIES (MYCOBACTERIA)  APTT  LACTIC ACID, PLASMA  URINALYSIS, ROUTINE W REFLEX MICROSCOPIC  CSF CELL COUNT WITH DIFFERENTIAL  CSF CELL COUNT WITH DIFFERENTIAL  PROTEIN AND GLUCOSE, CSF  VDRL, CSF  CRYPTOCOCCAL ANTIGEN, CSF  CRYPTOCOCCAL ANTIGEN  TOXOPLASMA ANTIBODIES- IGG AND  IGM    EKG EKG Interpretation  Date/Time:  Wednesday April 14 2019 21:18:24 EST Ventricular Rate:  104 PR Interval:    QRS Duration: 86 QT Interval:  331 QTC Calculation: 436 R Axis:   91 Text Interpretation: Sinus tachycardia Borderline right axis deviation No previous ECGs available Confirmed by Wandra Arthurs ([id number]) on 04/14/2019 9:45:54 PM   Radiology CT Head Wo Contrast  Result Date: 04/14/2019 CLINICAL DATA:  Cerebral hemorrhage suspected. EXAM: CT HEAD WITHOUT CONTRAST TECHNIQUE: Contiguous axial images were obtained from the base of the skull through the vertex without intravenous contrast. COMPARISON:  None. FINDINGS: Brain: No evidence of acute infarction, hemorrhage,  hydrocephalus, extra-axial collection or mass lesion/mass effect. Vascular: No hyperdense vessel or unexpected calcification. Skull: Normal. Negative for fracture or focal lesion. Sinuses/Orbits: There is mucosal thickening of the left frontal sinus and ethmoid air cells. The remaining paranasal sinuses and mastoid air cells are essentially clear. Other: There are pockets of gas bilaterally which are likely intravenous from an IV start. IMPRESSION: No acute intracranial abnormality. Electronically Signed  By: Katherine Mantle M.D.   On: 04/14/2019 21:09   DG Chest Port 1 View  Result Date: 04/14/2019 CLINICAL DATA:  Fever with altered mental status EXAM: PORTABLE CHEST 1 VIEW COMPARISON:  None. FINDINGS: The heart size and mediastinal contours are within normal limits. Both lungs are clear. The visualized skeletal structures are unremarkable. IMPRESSION: No active disease. Electronically Signed   By: Jasmine Pang M.D.   On: 04/14/2019 20:47    Procedures .Lumbar Puncture  Date/Time: 04/14/2019 11:18 PM Performed by: Charlynne Pander, MD Authorized by: Charlynne Pander, MD   Consent:    Consent obtained:  Verbal   Consent given by:  Patient   Risks discussed:  Infection Pre-procedure details:    Procedure purpose:  Therapeutic   Preparation: Patient was prepped and draped in usual sterile fashion   Anesthesia (see MAR for exact dosages):    Anesthesia method:  Local infiltration   Local anesthetic:  Lidocaine 2% w/o epi Procedure details:    Lumbar space:  L4-L5 interspace   Patient position:  L lateral decubitus   Needle gauge:  22   Needle type:  Diamond point   Needle length (in):  3.5   Ultrasound guidance: no     Number of attempts:  2 Post-procedure:    Puncture site:  Adhesive bandage applied   Patient tolerance of procedure:  Procedure terminated at patient's request Comments:     Patient unable to tolerate procedure    (including critical care time)  CRITICAL  CARE Performed by: Richardean Canal   Total critical care time:  Critical care time was exclusive of separately billable procedures and treating other patients.  Critical care was necessary to treat or prevent imminent or life-threatening deterioration.  Critical care was time spent personally by me on the following activities: development of treatment plan with patient and/or surrogate as well as nursing, discussions with consultants, evaluation of patient's response to treatment, examination of patient, obtaining history from patient or surrogate, ordering and performing treatments and interventions, ordering and review of laboratory studies, ordering and review of radiographic studies, pulse oximetry and re-evaluation of patient's condition.    Medications Ordered in ED Medications  vancomycin (VANCOCIN) IVPB 1000 mg/200 mL premix (has no administration in time range)  ceFEPIme (MAXIPIME) 2 g in sodium chloride 0.9 % 100 mL IVPB (has no administration in time range)  sodium chloride 0.9 % bolus 1,000 mL (0 mLs Intravenous Stopped 04/14/19 2239)  sodium chloride 0.9 % bolus 1,000 mL (0 mLs Intravenous Stopped 04/14/19 2239)    And  sodium chloride 0.9 % bolus 1,000 mL (0 mLs Intravenous Stopped 04/14/19 2143)  ceFEPIme (MAXIPIME) 2 g in sodium chloride 0.9 % 100 mL IVPB (0 g Intravenous Stopped 04/14/19 2132)  metroNIDAZOLE (FLAGYL) IVPB 500 mg (0 mg Intravenous Stopped 04/14/19 2227)  vancomycin (VANCOCIN) IVPB 1000 mg/200 mL premix (0 mg Intravenous Stopped 04/14/19 2227)  lidocaine (XYLOCAINE) 2 % (with pres) injection 200 mg (200 mg Intradermal Given by Other 04/14/19 2136)  sodium chloride 0.9 % bolus 1,000 mL (1,000 mLs Intravenous New Bag/Given 04/14/19 2241)    ED Course  I have reviewed the triage vital signs and the nursing notes.  Pertinent labs & imaging results that were available during my care of the patient were reviewed by me and considered in my medical decision making (see  chart for details).    MDM Rules/Calculators/A&P  Scott Butler is a 29 y.o. male here presenting with headache and hypotension.  Patient is hypotensive in the ED.  Patient is also not taking his HIV meds.  Patient is very sleepy but wakes up to exam. Differential is very broad as I am concerned that patient has AIDS.  He can have a number of opportunistic infection including toxoplasmosis and cryptococcus and PCP pneumonia.  Also consider Covid at this point as well.  9:30 PM WBC is 20. CT head unremarkable. CXR clear.  Patient does have acute renal failure as well. Patient sodium is 127.  I consented him for lumbar puncture.  He grudgingly have me called the father to get verbal consent.  I attempted to perform an LP to rule out toxo and cryptococcus.  Unfortunately, he did not tolerate the procedure and asked me to stop the procedure.  Patient already received Vanco and cefepime and Flagyl.  I discussed case with Dr. Ilsa Iha from infectious disease.  She recommend continue current antibiotics.  We can send off serum cryptococcus and serum toxoplasmosis and ID will see patient.   11:14 PM Patient still hypotensive to 70s despite 30 cc/kg bolus. I initially called hospitalist but since he is still hypotensive, they request critical care consult. Critical care at bedside and will admit and start levophed for septic shock. Patient is COVID positive as well    Scott Butler was evaluated in Emergency Department on 04/14/2019 for the symptoms described in the history of present illness. He was evaluated in the context of the global COVID-19 pandemic, which necessitated consideration that the patient might be at risk for infection with the SARS-CoV-2 virus that causes COVID-19. Institutional protocols and algorithms that pertain to the evaluation of patients at risk for COVID-19 are in a state of rapid change based on information released by regulatory bodies including the CDC and  federal and state organizations. These policies and algorithms were followed during the patient's care in the ED.    Final Clinical Impression(s) / ED Diagnoses Final diagnoses:  None    Rx / DC Orders ED Discharge Orders    None       Charlynne Pander, MD 04/14/19 2319

## 2019-04-14 NOTE — ED Notes (Signed)
Pt reports needing to have a bowel movement, offered bedpan, pt refused stating "I will hold it." Alert, pale, c/o back pain, and requesting pain meds

## 2019-04-14 NOTE — ED Notes (Signed)
MD in room to perform lumbar puncture

## 2019-04-14 NOTE — ED Triage Notes (Signed)
The pt is c/o a headache all day  With n and v  He does not usually have headaches  Slow to answer questions keeps both his eyes closed  He ha had advil   Does not know when his last dose was

## 2019-04-14 NOTE — ED Notes (Signed)
4L and 5L NS  infusing

## 2019-04-15 ENCOUNTER — Other Ambulatory Visit: Payer: Self-pay

## 2019-04-15 ENCOUNTER — Inpatient Hospital Stay (HOSPITAL_COMMUNITY): Payer: Self-pay

## 2019-04-15 ENCOUNTER — Encounter (HOSPITAL_COMMUNITY): Payer: Self-pay | Admitting: Critical Care Medicine

## 2019-04-15 DIAGNOSIS — U071 COVID-19: Secondary | ICD-10-CM | POA: Insufficient documentation

## 2019-04-15 DIAGNOSIS — K819 Cholecystitis, unspecified: Secondary | ICD-10-CM

## 2019-04-15 DIAGNOSIS — A419 Sepsis, unspecified organism: Secondary | ICD-10-CM

## 2019-04-15 DIAGNOSIS — R6521 Severe sepsis with septic shock: Secondary | ICD-10-CM

## 2019-04-15 LAB — BASIC METABOLIC PANEL
Anion gap: 9 (ref 5–15)
BUN: 11 mg/dL (ref 6–20)
CO2: 22 mmol/L (ref 22–32)
Calcium: 7.6 mg/dL — ABNORMAL LOW (ref 8.9–10.3)
Chloride: 105 mmol/L (ref 98–111)
Creatinine, Ser: 1.14 mg/dL (ref 0.61–1.24)
GFR calc Af Amer: >60 mL/min (ref >60)
GFR calc non Af Amer: >60 mL/min (ref >60)
Glucose, Bld: 107 mg/dL — ABNORMAL HIGH (ref 70–99)
Potassium: 4.5 mmol/L (ref 3.5–5.1)
Sodium: 136 mmol/L (ref 135–145)

## 2019-04-15 LAB — URINALYSIS, ROUTINE W REFLEX MICROSCOPIC
Bilirubin Urine: NEGATIVE
Glucose, UA: NEGATIVE mg/dL
Ketones, ur: NEGATIVE mg/dL
Leukocytes,Ua: NEGATIVE
Nitrite: NEGATIVE
Protein, ur: NEGATIVE mg/dL
Specific Gravity, Urine: 1.033 — ABNORMAL HIGH (ref 1.005–1.030)
pH: 5 (ref 5.0–8.0)

## 2019-04-15 LAB — CD4/CD8 (T-HELPER/T-SUPPRESSOR CELL)
CD4 absolute: 480 /uL (ref 400–1790)
CD4%: 33 % (ref 33–65)
CD8 T Cell Abs: 517 /uL (ref 190–1000)
CD8tox: 35 % (ref 12–40)
Ratio: 0.93 — ABNORMAL LOW (ref 1.0–3.0)
Total lymphocyte count: 1467 /uL (ref 1000–4000)

## 2019-04-15 LAB — LACTATE DEHYDROGENASE: LDH: 119 U/L (ref 98–192)

## 2019-04-15 LAB — FERRITIN: Ferritin: 114 ng/mL (ref 24–336)

## 2019-04-15 LAB — CBC
HCT: 38.3 % — ABNORMAL LOW (ref 39.0–52.0)
Hemoglobin: 12.5 g/dL — ABNORMAL LOW (ref 13.0–17.0)
MCH: 28.5 pg (ref 26.0–34.0)
MCHC: 32.6 g/dL (ref 30.0–36.0)
MCV: 87.4 fL (ref 80.0–100.0)
Platelets: 283 10*3/uL (ref 150–400)
RBC: 4.38 MIL/uL (ref 4.22–5.81)
RDW: 13.5 % (ref 11.5–15.5)
WBC: 17.3 10*3/uL — ABNORMAL HIGH (ref 4.0–10.5)
nRBC: 0 % (ref 0.0–0.2)

## 2019-04-15 LAB — ECHOCARDIOGRAM COMPLETE
Height: 64 in
Weight: 2370.39 oz

## 2019-04-15 LAB — MAGNESIUM: Magnesium: 1.3 mg/dL — ABNORMAL LOW (ref 1.7–2.4)

## 2019-04-15 LAB — GLUCOSE, CAPILLARY: Glucose-Capillary: 92 mg/dL (ref 70–99)

## 2019-04-15 LAB — C-REACTIVE PROTEIN: CRP: 13.7 mg/dL — ABNORMAL HIGH (ref <1.0)

## 2019-04-15 LAB — CRYPTOCOCCAL ANTIGEN: Crypto Ag: NEGATIVE

## 2019-04-15 LAB — PHOSPHORUS: Phosphorus: 3.7 mg/dL (ref 2.5–4.6)

## 2019-04-15 LAB — LACTIC ACID, PLASMA: Lactic Acid, Venous: 2.7 mmol/L (ref 0.5–1.9)

## 2019-04-15 LAB — D-DIMER, QUANTITATIVE: D-Dimer, Quant: 2.54 ug/mL-FEU — ABNORMAL HIGH (ref 0.00–0.50)

## 2019-04-15 MED ORDER — LACTATED RINGERS IV SOLN: INTRAVENOUS | Status: DC

## 2019-04-15 MED ORDER — SODIUM CHLORIDE 0.9 % IV SOLN
2.0000 g | Freq: Three times a day (TID) | INTRAVENOUS | Status: DC
  Administered 2019-04-15: 2 g via INTRAVENOUS
  Filled 2019-04-15: qty 2

## 2019-04-15 MED ORDER — BICTEGRAVIR-EMTRICITAB-TENOFOV 50-200-25 MG PO TABS
1.0000 | ORAL_TABLET | Freq: Every day | ORAL | Status: DC
  Administered 2019-04-15 – 2019-04-16 (×2): 1 via ORAL
  Filled 2019-04-15 (×2): qty 1

## 2019-04-15 MED ORDER — HEPARIN SODIUM (PORCINE) 5000 UNIT/ML IJ SOLN: 5000.0000 [IU] | Freq: Three times a day (TID) | INTRAMUSCULAR | Status: DC

## 2019-04-15 MED ORDER — MIDODRINE HCL 5 MG PO TABS
5.0000 mg | ORAL_TABLET | Freq: Three times a day (TID) | ORAL | Status: DC
  Administered 2019-04-15 – 2019-04-16 (×3): 5 mg via ORAL
  Filled 2019-04-15 (×3): qty 1

## 2019-04-15 MED ORDER — PIPERACILLIN-TAZOBACTAM 3.375 G IVPB
3.3750 g | Freq: Three times a day (TID) | INTRAVENOUS | Status: DC
  Administered 2019-04-15 – 2019-04-16 (×2): 3.375 g via INTRAVENOUS
  Filled 2019-04-15 (×2): qty 50

## 2019-04-15 MED ORDER — HEPARIN SODIUM (PORCINE) 5000 UNIT/ML IJ SOLN
5000.0000 [IU] | Freq: Three times a day (TID) | INTRAMUSCULAR | Status: DC
  Filled 2019-04-15: qty 1

## 2019-04-15 MED ORDER — CHLORHEXIDINE GLUCONATE CLOTH 2 % EX PADS
6.0000 | MEDICATED_PAD | Freq: Every day | CUTANEOUS | Status: DC
  Administered 2019-04-16: 6 via TOPICAL

## 2019-04-15 MED ORDER — HEPARIN SODIUM (PORCINE) 5000 UNIT/ML IJ SOLN
5000.0000 [IU] | Freq: Three times a day (TID) | INTRAMUSCULAR | Status: DC
  Administered 2019-04-15 – 2019-04-16 (×2): 5000 [IU] via SUBCUTANEOUS
  Filled 2019-04-15 (×2): qty 1

## 2019-04-15 MED ORDER — VANCOMYCIN HCL 750 MG/150ML IV SOLN
750.0000 mg | Freq: Two times a day (BID) | INTRAVENOUS | Status: DC
  Administered 2019-04-15 – 2019-04-16 (×3): 750 mg via INTRAVENOUS
  Filled 2019-04-15 (×3): qty 150

## 2019-04-15 MED ORDER — SACCHAROMYCES BOULARDII 250 MG PO CAPS
250.0000 mg | ORAL_CAPSULE | Freq: Two times a day (BID) | ORAL | Status: DC
  Administered 2019-04-15 – 2019-04-16 (×2): 250 mg via ORAL
  Filled 2019-04-15 (×2): qty 1

## 2019-04-15 MED ORDER — IOHEXOL 300 MG/ML  SOLN
100.0000 mL | Freq: Once | INTRAMUSCULAR | Status: AC | PRN
  Administered 2019-04-15: 100 mL via INTRAVENOUS

## 2019-04-15 MED ORDER — MAGNESIUM SULFATE 4 GM/100ML IV SOLN
4.0000 g | Freq: Once | INTRAVENOUS | Status: AC
  Administered 2019-04-15: 4 g via INTRAVENOUS
  Filled 2019-04-15: qty 100

## 2019-04-15 NOTE — Progress Notes (Signed)
Spoke with patient about needing to give medications that were scheduled for him. Patient asks "when am I going to get some food", this RN explained why the providers are not allowing the pt food at this time, the patient states "then you can tell the doctors I ain't cooperating with sh** until I get some food". This RN discussed purpose and importance of medications that are scheduled at this time, patient then states "not until I get some food". This RN reached out to provider to re-check if pt would be allowed to eat, it was confirmed the pt cannot eat until GI can see the pt. Will chart refused medications as patient refuses, and will continue to monitor and educate the patient.

## 2019-04-15 NOTE — Progress Notes (Signed)
NAME:  Scott Butler, MRN:  [id number], DOB:  [birth year], LOS: 0 ADMISSION DATE:  04/14/2019, CONSULTATION DATE:  3/3 REFERRING MD:  Dr Silverio Lay EDP, CHIEF COMPLAINT:  hypotension   Brief History   29 year old male with HIV admitted 3/3 with presumably septic shock of unclear origin.   He presented to the [facility] ED with complaints of headache, back pain, shoulder pain. All of these symptoms had an onset of 3/3. He reports no complaints the day prior. Woke up with pain 3/3. Also complains of nausea, but no vomiting.   Past Medical History   has a past medical history of HIV (human immunodeficiency virus infection) (HCC) and Marijuana use (08/16/2015).  Significant Hospital Events   3/3 admit  Consults:    Procedures:    Significant Diagnostic Tests:  LP 3/3 > CT abdomen, pelvis 3/4 > mild wall thickening within the colon suspicious for mild enteritis, periportal edema consistent with hepatic inflammation, changes suspicious for acute cholecystitis Abdominal ultrasound 3/3: Findings suspicious of a calculus cholecystitis 3/3: CT head negative  Micro Data:  Blood 3/3 > Urine 3/3 > CSF 3/3 >  Antimicrobials:  Flagyl 3/3  Cefepime 3/3 >3/4 Zosyn 3/4>>> Vanco 3/3 >  Interim history/subjective:  Withdrawn, seems annoyed with staff, minimally cooperative Objective   Blood pressure 110/68, pulse 88, temperature 98.5 F (36.9 C), temperature source Oral, resp. rate 18, height 5\' 4"  (1.626 m), weight 67.2 kg, SpO2 100 %.        Intake/Output Summary (Last 24 hours) at 04/15/2019 0946 Last data filed at 04/15/2019 0900 Gross per 24 hour  Intake 6100.25 ml  Output 400 ml  Net 5700.25 ml   Filed Weights   04/14/19 1950 04/14/19 2121 04/15/19 0421  Weight: 65.8 kg 65.8 kg 67.2 kg    Examination: General: Otherwise healthy-appearing 29 year old male resting in bed and in no acute distress continues to report he would like to eat HEENT normocephalic atraumatic no jugular  venous distention appreciated Pulmonary: Clear to auscultation without accessory use currently on room air Cardiac: Regular rate and rhythm Abdomen nontender to palpation positive bowel sounds, no tenderness currently  Extremities warm and dry brisk capillary refill Neuro awake oriented no focal deficits GU due to void  Resolved Hospital Problem list     Assessment & Plan:   Septic shock Immunocompromised host in the setting HIV. Last CD4 a year ago was 690. CT and ultrasound of abdomen suggesting acalculus cholecystitis; as well as mild enteritis-->but no LFT abnormality.  plan Continue to titrate norepinephrine for mean arterial pressure greater than 65 Continue IV fluids Empiric antibiotics, currently day #2, will treat with Zosyn and vancomycin Will ask GI to see, radiographically has evidence of acute cholecystitis but also has evidence of enteritis.  He does not have any transaminitis at this point.  Would like their thoughts in regards to percutaneous drainage of gallbladder Trend CBC, currently trending down  HIV: seems to have been compliant with Biktarvy recently. Followed by Dr. 26 Plan Follow-up CD4 Follow-up opportunistic infection work-up, AFB, toxoplasma, cryptococcal, VDRL, and fungus culture Okay to resume antiretroviral Will notify infectious disease of admission  COVID-19 infection. No active pulmonary disease. On room air.  - Currently no indication for Remdesivir or steroids.  Plan Continue to trend daily inflammatory indices  AKI: prerenal.,  This resolved with fluid resuscitation Plan Avoid hypotension Daily chemistries Renal dose medications  Fluid and electrolyte imbalance: Hypomagnesemia Plan Replace and then recheck   Best  practice:  Diet: NPO Pain/Anxiety/Delirium protocol (if indicated): NA VAP protocol (if indicated): NA DVT prophylaxis: SQH GI prophylaxis: NA Glucose control: NA Mobility: BR Code Status: FULL Family  Communication: Patient updated Disposition: ICU    Critical care time:  34 minutes.    Erick Colace ACNP-BC [place] Pager # [phone] OR # [phone] if no answer

## 2019-04-15 NOTE — Progress Notes (Signed)
Pt requesting bathroom assistance. This RN to room to help. Patient is irritated with being connected to IVs and EKG leads and expresses that he either wants to eat now or he is going to leave. Patient educated on major risks of leaving AMA by this RN, and I told him I would ask a provider once again. Spoke with Dr. Wynona Butler who will allow pt to eat if he will stay here. Orders received, will update patient and continue to monitor.

## 2019-04-15 NOTE — Progress Notes (Signed)
  Echocardiogram 2D Echocardiogram has been performed.  Stark Bray Swaim 04/15/2019, 10:29 AM

## 2019-04-15 NOTE — Progress Notes (Signed)
Patient has not urinated since admission to 30M. Bladder scan showed 200 ml in bladder. He was encouraged to urinate by RN and to provide a urine sample. He stated that he will urinate when he "feels like it". There is an order for an I&O but patient is adamantly refusing for RN to perform procedure. He was educated. Will continue to monitor.

## 2019-04-15 NOTE — Progress Notes (Signed)
Patient agitating to eat  Will sign AMA if he is not allowed to eat  Unfortunately, he is still hypotensive on pressors  We will add midodrine orally, wean pressors as able We will feed him  GI evaluation pending

## 2019-04-15 NOTE — Progress Notes (Signed)
Patient's father called the ICU and I updated him to the patient's condition while in the patient's room.  No further questions at this time.

## 2019-04-15 NOTE — Plan of Care (Signed)
  Problem: Education: Goal: Knowledge of risk factors and measures for prevention of condition will improve Outcome: Progressing   Problem: Coping: Goal: Psychosocial and spiritual needs will be supported Outcome: Progressing   Problem: Respiratory: Goal: Will maintain a patent airway Outcome: Progressing Goal: Complications related to the disease process, condition or treatment will be avoided or minimized Outcome: Progressing   

## 2019-04-15 NOTE — Consult Note (Signed)
Referring Provider:  Dr. Wynona Neat Primary Care Physician:  Doylene Canard, PA Primary Gastroenterologist: None (unassigned)  Reason for Consultation: Questionable acalculous cholecystitis  HPI: Scott Butler is a 29 y.o. male HIV positive, Covid positive, admitted yesterday with head, back, and shoulder pain and a picture of septic shock.    He denies antecedent abdominal pain, nausea, or vomiting.  As of this time, he is hungry and has been tolerating clear liquids and light snacks without any provocation of abdominal symptoms.  Since coming into the hospital and being started on antibiotics, he is having diarrhea.  He indicates he was not having it prior to admission.  His family history is unknown.  CT and ultrasound in the emergency room showed evidence for fluid around the gallbladder and gallbladder wall thickening, without stones, suggestive of acalculous cholecystitis.    Blood cultures are negative so far and white count has dropped overnight on antibiotic therapy from 20,000 to17,000.    Liver chemistries are normal, no biliary ductal dilatation (6 mm diameter).  The patient is being weaned off his Levophed, which is currently minimal in amount.   Past Medical History:  Diagnosis Date  . HIV (human immunodeficiency virus infection) (HCC)   . Marijuana use 08/16/2015    Past Surgical History:  Procedure Laterality Date  . removal of anal warts      Prior to Admission medications   Medication Sig Start Date End Date Taking? Authorizing Provider  bictegravir-emtricitabine-tenofovir AF (BIKTARVY) 50-200-25 MG TABS tablet Take 1 tablet by mouth daily. 04/15/18  Yes Daiva Eves, Lisette Grinder, MD  ibuprofen (ADVIL) 200 MG tablet Take 800 mg by mouth as needed for moderate pain.   Yes [provider]    Current Facility-Administered Medications  Medication Dose Route Frequency Provider Last Rate Last Admin  . bictegravir-emtricitabine-tenofovir AF (BIKTARVY)  50-200-25 MG per tablet 1 tablet  1 tablet Oral Daily Simonne Martinet, NP   1 tablet at 04/15/19 1123  . Chlorhexidine Gluconate Cloth 2 % PADS 6 each  6 each Topical Daily Olalere, Adewale A, MD      . heparin injection 5,000 Units  5,000 Units Subcutaneous Q8H Hoffman, Clarene Critchley, NP      . lactated ringers infusion   Intravenous Continuous Olalere, Adewale A, MD 125 mL/hr at 04/15/19 1100 Rate Verify at 04/15/19 1100  . magnesium sulfate IVPB 4 g 100 mL  4 g Intravenous Once Simonne Martinet, NP 50 mL/hr at 04/15/19 1150 4 g at 04/15/19 1150  . norepinephrine (LEVOPHED) 4mg  in premix infusion  0-10 mcg/min Intravenous Titrated , NP 11.25 mL/hr at 04/15/19 1100 3 mcg/min at 04/15/19 1100  . piperacillin-tazobactam (ZOSYN) IVPB 3.375 g  3.375 g Intravenous Q8H 06/15/19, RPH      . vancomycin (VANCOREADY) IVPB 750 mg/150 mL  750 mg Intravenous Q12H Daylene Posey, [facility]   Stopped at 04/15/19 1014    Allergies as of 04/14/2019  . (No Known Allergies)    No family history on file.  Social History   Socioeconomic History  . Marital status: Divorced    Spouse name: Not on file  . Number of children: Not on file  . Years of education: Not on file  . Highest education level: Not on file  Occupational History  . Not on file  Tobacco Use  . Smoking status: Current Every Day Smoker  . Smokeless tobacco: Never Used  Substance and Sexual Activity  . Alcohol use: No  .  Drug use: Yes    Types: Marijuana  . Sexual activity: Not on file  Other Topics Concern  . Not on file  Social History Narrative  . Not on file   Social Determinants of Health   Financial Resource Strain:   . Difficulty of Paying Living Expenses: Not on file  Food Insecurity:   . Worried About Programme researcher, broadcasting/film/video in the Last Year: Not on file  . Ran Out of Food in the Last Year: Not on file  Transportation Needs:   . Lack of Transportation (Medical): Not on file  . Lack of Transportation  (Non-Medical): Not on file  Physical Activity:   . Days of Exercise per Week: Not on file  . Minutes of Exercise per Session: Not on file  Stress:   . Feeling of Stress : Not on file  Social Connections:   . Frequency of Communication with Friends and Family: Not on file  . Frequency of Social Gatherings with Friends and Family: Not on file  . Attends Religious Services: Not on file  . Active Member of Clubs or Organizations: Not on file  . Attends Banker Meetings: Not on file  . Marital Status: Not on file  Intimate Partner Violence:   . Fear of Current or Ex-Partner: Not on file  . Emotionally Abused: Not on file  . Physically Abused: Not on file  . Sexually Abused: Not on file     Physical Exam: Vital signs in last 24 hours: Temp:  [97.7 F (36.5 C)-98.5 F (36.9 C)] 98.5 F (36.9 C) (03/04 0800) Pulse Rate:  [75-105] 80 (03/04 1115) Resp:  [13-32] 20 (03/04 1115) BP: (58-116)/(29-84) 99/62 (03/04 1115) SpO2:  [95 %-100 %] 99 % (03/04 1115) Weight:  [65.8 kg-67.2 kg] 67.2 kg (03/04 0421) Last BM Date: 04/15/19  A thin but well-nourished Caucasian male, lying in bed, very quiet and only minimally communicative, somewhat withdrawn, in no distress whatsoever.  Anicteric, no evident pallor.  Chest clear.  Heart normal.  Abdomen has normal bowel sounds and no distention, mass-effect, or guarding or tenderness with particular reference to the right upper quadrant.  No peripheral edema.  No obvious focal neurologic deficits.  Intake/Output from previous day: 03/03 0701 - 03/04 0700 In: 5869.8 [I.V.:1569.8; IV Piggyback:4300] Out: -  Intake/Output this shift: Total I/O In: 532.6 [I.V.:282.4; IV Piggyback:250.1] Out: 400 [Urine:400]  Lab Results: Recent Labs    04/14/19 2039 04/14/19 2058 04/15/19 0541  WBC 20.1*  --  17.3*  HGB 14.9 15.6 12.5*  HCT 45.3 46.0 38.3*  PLT 334  --  283   BMET Recent Labs    04/14/19 2039 04/14/19 2058 04/15/19 0541   NA 127* 134* 136  K 4.1 4.1 4.5  CL 94* 97* 105  CO2 23  --  22  GLUCOSE 118* 115* 107*  BUN 13 13 11   CREATININE 1.82* 1.70* 1.14  CALCIUM 8.6*  --  7.6*   LFT Recent Labs    04/14/19 2039  PROT 6.9  ALBUMIN 3.9  AST 18  ALT 13  ALKPHOS 54  BILITOT 1.0   PT/INR Recent Labs    04/14/19 2039  LABPROT 15.5*  INR 1.2    Studies/Results: CT Head Wo Contrast  Result Date: 04/14/2019 CLINICAL DATA:  Cerebral hemorrhage suspected. EXAM: CT HEAD WITHOUT CONTRAST TECHNIQUE: Contiguous axial images were obtained from the base of the skull through the vertex without intravenous contrast. COMPARISON:  None. FINDINGS: Brain: No evidence of  acute infarction, hemorrhage, hydrocephalus, extra-axial collection or mass lesion/mass effect. Vascular: No hyperdense vessel or unexpected calcification. Skull: Normal. Negative for fracture or focal lesion. Sinuses/Orbits: There is mucosal thickening of the left frontal sinus and ethmoid air cells. The remaining paranasal sinuses and mastoid air cells are essentially clear. Other: There are pockets of gas bilaterally which are likely intravenous from an IV start. IMPRESSION: No acute intracranial abnormality. Electronically Signed   By: Constance Holster M.D.   On: 04/14/2019 21:09   CT ABDOMEN PELVIS W CONTRAST  Result Date: 04/15/2019 CLINICAL DATA:  Abdominal pain, history of COVID-19 positivity EXAM: CT ABDOMEN AND PELVIS WITH CONTRAST TECHNIQUE: Multidetector CT imaging of the abdomen and pelvis was performed using the standard protocol following bolus administration of intravenous contrast. CONTRAST:  122mL OMNIPAQUE IOHEXOL 300 MG/ML  SOLN COMPARISON:  None. FINDINGS: Lower chest: Lung bases demonstrate mild right basilar atelectatic changes. Hepatobiliary: Liver demonstrates periportal hypodensity consistent with acute hepatic inflammatory change. The gallbladder is well distended with fluid surrounding the gallbladder suspicious for acute  cholecystitis. Pancreas: Unremarkable. No pancreatic ductal dilatation or surrounding inflammatory changes. Spleen: Normal in size without focal abnormality. Adrenals/Urinary Tract: Adrenal glands are within normal limits. Kidneys demonstrate a normal enhancement pattern with the exception of a lower pole cyst on the left. No renal calculi or obstructive changes are seen. Bladder is well distended. Stomach/Bowel: Stomach is distended with fluid. No obstructive changes of the small bowel are noted. Mild wall thickening is noted within the colon which may be related to incomplete distension although the possibility of a mild enteritis could not be totally excluded. Vascular/Lymphatic: No significant vascular findings are present. No enlarged abdominal or pelvic lymph nodes. Reproductive: Prostate is unremarkable. Other: No abdominal wall hernia or abnormality. No abdominopelvic ascites. Musculoskeletal: No acute or significant osseous findings. IMPRESSION: Mild wall thickening within the colon suspicious for a mild enteritis. Periportal edema consistent with hepatic inflammatory change. There are changes suspicious for acute cholecystitis. Ultrasound may be helpful for further evaluation. Electronically Signed   By: Inez Catalina M.D.   On: 04/15/2019 01:39   DG Chest Port 1 View  Result Date: 04/14/2019 CLINICAL DATA:  Fever with altered mental status EXAM: PORTABLE CHEST 1 VIEW COMPARISON:  None. FINDINGS: The heart size and mediastinal contours are within normal limits. Both lungs are clear. The visualized skeletal structures are unremarkable. IMPRESSION: No active disease. Electronically Signed   By: Donavan Foil M.D.   On: 04/14/2019 20:47   US Abdomen Limited RUQ  Result Date: 04/15/2019 CLINICAL DATA:  Abdominal pain EXAM: ULTRASOUND ABDOMEN LIMITED RIGHT UPPER QUADRANT COMPARISON:  None. FINDINGS: Gallbladder: There is diffuse gallbladder wall thickening measuring up to 8 mm. There is a small amount of  pericholecystic fluid. A positive sonographic Percell Miller sign is noted. No definite layering stones however are noted. Common bile duct: Diameter: 6 mm. Liver: No focal lesion identified. Within normal limits in parenchymal echogenicity. Portal vein is patent on color Doppler imaging with normal direction of blood flow towards the liver. Other: None. IMPRESSION: Findings suggestive of acalculous cholecystitis. Electronically Signed   By: Prudencio Pair M.D.   On: 04/15/2019 02:28    Impression: 1.  Septic shock picture, without obvious source, cultures negative to date.    2.  Radiographic evidence of possible gallbladder inflammation, but without clinical or biochemical evidence to support that diagnosis.  Plan: 1.  On balance, I think it is unlikely this patient has acalculous cholecystitis, but if he does, it  seems to be responding nicely to antibiotic therapy.  Therefore, I do not think that surgical intervention or percutaneous drainage is necessary.  2.  Diarrhea is most likely antibiotic induced, will provide Florastor probiotic therapy.  3.  We will follow at a distance, call anytime if further input from [place] is needed.    LOS: 0 days   Katy Fitch Rachael Ferrie  04/15/2019, 11:51 AM   Pager [phone] If no answer or after 5 PM call [phone]

## 2019-04-16 LAB — COMPREHENSIVE METABOLIC PANEL
ALT: 11 U/L (ref 0–44)
AST: 19 U/L (ref 15–41)
Albumin: 2.5 g/dL — ABNORMAL LOW (ref 3.5–5.0)
Alkaline Phosphatase: 45 U/L (ref 38–126)
Anion gap: 7 (ref 5–15)
BUN: 8 mg/dL (ref 6–20)
CO2: 23 mmol/L (ref 22–32)
Calcium: 7.9 mg/dL — ABNORMAL LOW (ref 8.9–10.3)
Chloride: 104 mmol/L (ref 98–111)
Creatinine, Ser: 0.94 mg/dL (ref 0.61–1.24)
GFR calc Af Amer: >60 mL/min (ref >60)
GFR calc non Af Amer: >60 mL/min (ref >60)
Glucose, Bld: 110 mg/dL — ABNORMAL HIGH (ref 70–99)
Potassium: 3.6 mmol/L (ref 3.5–5.1)
Sodium: 134 mmol/L — ABNORMAL LOW (ref 135–145)
Total Bilirubin: 0.4 mg/dL (ref 0.3–1.2)
Total Protein: 5.3 g/dL — ABNORMAL LOW (ref 6.5–8.1)

## 2019-04-16 LAB — CBC WITH DIFFERENTIAL/PLATELET
Abs Immature Granulocytes: 0.02 10*3/uL (ref 0.00–0.07)
Basophils Absolute: 0 10*3/uL (ref 0.0–0.1)
Basophils Relative: 0 %
Eosinophils Absolute: 0.1 10*3/uL (ref 0.0–0.5)
Eosinophils Relative: 1 %
HCT: 40.7 % (ref 39.0–52.0)
Hemoglobin: 13.6 g/dL (ref 13.0–17.0)
Immature Granulocytes: 0 %
Lymphocytes Relative: 18 %
Lymphs Abs: 1.7 10*3/uL (ref 0.7–4.0)
MCH: 28.5 pg (ref 26.0–34.0)
MCHC: 33.4 g/dL (ref 30.0–36.0)
MCV: 85.1 fL (ref 80.0–100.0)
Monocytes Absolute: 0.8 10*3/uL (ref 0.1–1.0)
Monocytes Relative: 8 %
Neutro Abs: 7.1 10*3/uL (ref 1.7–7.7)
Neutrophils Relative %: 73 %
Platelets: 252 10*3/uL (ref 150–400)
RBC: 4.78 MIL/uL (ref 4.22–5.81)
RDW: 13.5 % (ref 11.5–15.5)
WBC: 9.7 10*3/uL (ref 4.0–10.5)
nRBC: 0 % (ref 0.0–0.2)

## 2019-04-16 LAB — LACTATE DEHYDROGENASE: LDH: 107 U/L (ref 98–192)

## 2019-04-16 LAB — RAPID URINE DRUG SCREEN, HOSP PERFORMED
Amphetamines: POSITIVE — AB
Barbiturates: NOT DETECTED
Benzodiazepines: NOT DETECTED
Cocaine: NOT DETECTED
Opiates: NOT DETECTED
Tetrahydrocannabinol: NOT DETECTED

## 2019-04-16 LAB — TOXOPLASMA ANTIBODIES- IGG AND  IGM
Toxoplasma Antibody- IgM: <3 AU/mL (ref 0.0–7.9)
Toxoplasma IgG Ratio: <3 IU/mL (ref 0.0–7.1)

## 2019-04-16 LAB — HIV-1 RNA QUANT-NO REFLEX-BLD
HIV 1 RNA Quant: <20 copies/mL
LOG10 HIV-1 RNA: UNDETERMINED log10copy/mL

## 2019-04-16 LAB — C-REACTIVE PROTEIN: CRP: 17.6 mg/dL — ABNORMAL HIGH (ref <1.0)

## 2019-04-16 LAB — FERRITIN: Ferritin: 109 ng/mL (ref 24–336)

## 2019-04-16 LAB — D-DIMER, QUANTITATIVE: D-Dimer, Quant: 7.76 ug/mL-FEU — ABNORMAL HIGH (ref 0.00–0.50)

## 2019-04-16 MED ORDER — HEPARIN SODIUM (PORCINE) 5000 UNIT/ML IJ SOLN: 7500.0000 [IU] | Freq: Three times a day (TID) | INTRAMUSCULAR | Status: DC

## 2019-04-16 MED ORDER — AMOXICILLIN-POT CLAVULANATE 875-125 MG PO TABS
1.0000 | ORAL_TABLET | Freq: Two times a day (BID) | ORAL | Status: DC
  Administered 2019-04-16: 1 via ORAL
  Filled 2019-04-16 (×2): qty 1

## 2019-04-16 NOTE — Progress Notes (Signed)
Patient informed RN that his ride would be here at 1515 and that he was going home either with or without Hita scan test.  Nuclear med called and inquired about what time the patient might be coming doen for test.  Nuc med tech informed RN that patient was pending in transporter que and they were not sure what time he would be coming down.  Patient called RN into room and wanted both IVs removed.  RN removed bilateral saline locks and gauze and tape applied to site.  Informed patient that he should self quarantine for 10 days as recommended by CDC guidelines.  Patient also told to return to ER if symptoms return or get worse.  AMA form signed by patient and patient escorted to exit of ICU.

## 2019-04-16 NOTE — Progress Notes (Addendum)
   NAME:  Scott Butler, MRN:  [id number], DOB:  [birth year], LOS: 1 ADMISSION DATE:  04/14/2019, CONSULTATION DATE:  3/3 REFERRING MD:  Dr Silverio Lay EDP, CHIEF COMPLAINT:  hypotension   Brief History   29 year old male with HIV admitted 3/3 with presumably septic shock of unclear origin.   He presented to the [facility] ED with complaints of headache, back pain, shoulder pain. All of these symptoms had an onset of 3/3. He reports no complaints the day prior. Woke up with pain 3/3. Also complains of nausea, but no vomiting.   Past Medical History   has a past medical history of HIV (human immunodeficiency virus infection) (HCC) and Marijuana use (08/16/2015).  Significant Hospital Events   3/3 admit  Consults:  Gastroenterology3/4  Procedures:  none  Significant Diagnostic Tests:  LP 3/3 > CT abdomen, pelvis 3/4 > mild wall thickening within the colon suspicious for mild enteritis, periportal edema consistent with hepatic inflammation, changes suspicious for acute cholecystitis Abdominal ultrasound 3/3: Findings suspicious of a calculus cholecystitis 3/3: CT head negative  Micro Data:  Blood 3/3 > Urine 3/3 > CSF 3/3 >  Antimicrobials:  Flagyl 3/3  Cefepime 3/3 >3/4 Zosyn 3/4>>> Vanco 3/3 >  Interim history/subjective:  Withdrawn Goes back to sleep easily Off pressors  Objective   Blood pressure 95/66, pulse 79, temperature 98.7 F (37.1 C), temperature source Oral, resp. rate 17, height 5\' 4"  (1.626 m), weight 68.7 kg, SpO2 97 %.        Intake/Output Summary (Last 24 hours) at 04/16/2019 0844 Last data filed at 04/16/2019 0700 Gross per 24 hour  Intake 3547.18 ml  Output 400 ml  Net 3147.18 ml   Filed Weights   04/14/19 2121 04/15/19 0421 04/16/19 0413  Weight: 65.8 kg 67.2 kg 68.7 kg    Examination: General: Young male, does not appear to be in distress  HEENT normocephalic, atraumatic Pulmonary: Clear breath sounds bilaterally  cardiac: Regular rate and  rhythm Abdomen: Nontender, bowel sounds appreciated Extremities warm and dry brisk capillary refill Neuro awake oriented no focal deficits GU due to void  Resolved Hospital Problem list     Assessment & Plan:  Septic shock immunocompromised host in the setting of HIV CT abdomen and ultrasound suggestive of a calculus cholecystitis and mild enteritis -Patient is weaned off pressors at present -Continues on IV fluids -On empiric antibiotics  -He is tolerating oral intake without nausea or abdominal pain  HIV -Compliant with Biktarvy recently -Followed by Dr. 06/16/19   Covid infection -No active pulmonary disease -He is on room air  Acute kidney injury -Prerenal, resolved with fluids  Acalculus cholecystitis -Appreciate GI input -We will order HIDA scan  Best practice:  Diet: Regular as tolerated Pain/Anxiety/Delirium protocol (if indicated): NA VAP protocol (if indicated): NA DVT prophylaxis: SQH GI prophylaxis: NA Glucose control: NA Mobility: BR Code Status: FULL Family Communication: Patient updated Disposition: We will transfer out of ICU as he is off pressors    Daiva Eves, MD [place], PCCM Cell: [phone]

## 2019-04-16 NOTE — Progress Notes (Signed)
Chart review only because of Covid positivity.  Patient seen late yesterday evening.  At that time, no significant findings on physical exam.  It is noted that today, his white count is normalized.  LFTs remain normal.  Given his tolerance of food and the overall clinical picture, I think acalculous cholecystitis is unlikely.  That said, the cause for sepsis remains unclear.  Blood cultures are negative as of this time.  Recommendation:  1.  Continue empiric antibiotic therapy.  2.  When the patient is stable, for example, out of ICU, consideration could be given to a hepatobiliary scan.  If abnormal, obtain surgical consultation.  I will sign off at this time because he seems to be doing very well from the GI tract standpoint, but would be happy to see the patient again at your request.  Florencia Reasons, M.D. Pager [phone] If no answer or after 5 PM call [phone]

## 2019-04-16 NOTE — Progress Notes (Signed)
Virl Diamond MD notified that patient is leaving AMA. Patient educated on s/s of low blood pressors and that if he needed any more medical care to go ER. Patient states that his ride will be her and that he "feels fine" unable to talk patient into staying for prescribed treatment.

## 2019-04-19 LAB — CULTURE, BLOOD (ROUTINE X 2)
Culture: NO GROWTH
Culture: NO GROWTH
Special Requests: ADEQUATE
Special Requests: ADEQUATE

## 2019-04-20 ENCOUNTER — Encounter: Payer: Self-pay | Admitting: Infectious Disease

## 2019-04-20 NOTE — Discharge Summary (Signed)
Physician Discharge Summary  Patient ID: Scott Butler MRN: [id number] DOB/AGE: [birth year] 28 y.o.  Admit date: 04/14/2019 Discharge date: 04/20/2019  Problem List Active Problems:   Septic shock [facility])  HPI: 29 year old gentleman was admitted with complaints of back pain, headache and shoulder pain.  Was lethargic on initial evaluation.  Hospital Course: Lethargy, hypotensive on initial exam Concern secondary to his altered mentation-had lumbar puncture performed For his hypotension-was started on pressors COVID-19 positive Background history of HIV, recent compliance with medication  He was continued on empiric antibiotics for possible cholangitis which was noted on abdominal CT and ultrasound of the abdomen  Patient stabilized after about 24 hours  Pressors weaned off  Patient was seen by gastroenterology for acalculus cholecystitis -HIDA scan recommended  As patient continued to improve, refusing interventions.   Decided he wanted to sign AGAINST MEDICAL ADVICE.  He was advised about risks associated but made an informed decision to leave AGAINST MEDICAL ADVICE    Labs at discharge Lab Results  Component Value Date   CREATININE 0.94 04/16/2019   BUN 8 04/16/2019   NA 134 (L) 04/16/2019   K 3.6 04/16/2019   CL 104 04/16/2019   CO2 23 04/16/2019   Lab Results  Component Value Date   WBC 9.7 04/16/2019   HGB 13.6 04/16/2019   HCT 40.7 04/16/2019   MCV 85.1 04/16/2019   PLT 252 04/16/2019   Lab Results  Component Value Date   ALT 11 04/16/2019   AST 19 04/16/2019   ALKPHOS 45 04/16/2019   BILITOT 0.4 04/16/2019   Lab Results  Component Value Date   INR 1.2 04/14/2019    Current radiology studies No results found.  Disposition:  Discharge AGAINST MEDICAL ADVICE   Allergies as of 04/16/2019   No Known Allergies     Medication List    ASK your doctor about these medications   bictegravir-emtricitabine-tenofovir AF 50-200-25 MG Tabs  tablet Commonly known as: Biktarvy Take 1 tablet by mouth daily.   ibuprofen 200 MG tablet Commonly known as: ADVIL Take 800 mg by mouth as needed for moderate pain.      Discharged Condition: Discharge AGAINST MEDICAL ADVICE  Time spent on discharge greater than 30 minutes.  Vital signs at Discharge.  AGAINST MEDICAL ADVICE   Office follow up Special Information or instructions. Signed: No follow-up scheduled

## 2019-04-27 ENCOUNTER — Other Ambulatory Visit: Payer: Self-pay

## 2019-05-04 ENCOUNTER — Other Ambulatory Visit: Payer: Self-pay | Admitting: Infectious Disease

## 2019-05-04 DIAGNOSIS — B20 Human immunodeficiency virus [HIV] disease: Secondary | ICD-10-CM

## 2019-05-10 ENCOUNTER — Encounter: Payer: Self-pay | Admitting: Infectious Disease

## 2019-06-22 ENCOUNTER — Other Ambulatory Visit: Payer: Self-pay | Admitting: Infectious Disease

## 2019-06-22 DIAGNOSIS — B20 Human immunodeficiency virus [HIV] disease: Secondary | ICD-10-CM

## 2019-12-13 ENCOUNTER — Other Ambulatory Visit: Payer: Self-pay

## 2019-12-13 ENCOUNTER — Other Ambulatory Visit: Payer: PRIVATE HEALTH INSURANCE

## 2019-12-13 DIAGNOSIS — B20 Human immunodeficiency virus [HIV] disease: Secondary | ICD-10-CM

## 2019-12-13 DIAGNOSIS — K6282 Dysplasia of anus: Secondary | ICD-10-CM

## 2019-12-13 DIAGNOSIS — Z720 Tobacco use: Secondary | ICD-10-CM

## 2019-12-14 LAB — T-HELPER CELL (CD4) - (RCID CLINIC ONLY)
CD4 % Helper T Cell: 27 % — ABNORMAL LOW (ref 33–65)
CD4 T Cell Abs: 678 /uL (ref 400–1790)

## 2019-12-16 ENCOUNTER — Telehealth: Payer: Self-pay

## 2019-12-16 LAB — COMPLETE METABOLIC PANEL WITH GFR
AG Ratio: 1.4 (calc) (ref 1.0–2.5)
ALT: 26 U/L (ref 9–46)
AST: 23 U/L (ref 10–40)
Albumin: 4.6 g/dL (ref 3.6–5.1)
Alkaline phosphatase (APISO): 66 U/L (ref 36–130)
BUN: 12 mg/dL (ref 7–25)
CO2: 30 mmol/L (ref 20–32)
Calcium: 9.8 mg/dL (ref 8.6–10.3)
Chloride: 100 mmol/L (ref 98–110)
Creat: 0.81 mg/dL (ref 0.60–1.35)
GFR, Est African American: 137 mL/min/{1.73_m2} (ref >=60)
GFR, Est Non African American: 118 mL/min/{1.73_m2} (ref >=60)
Globulin: 3.4 g/dL (calc) (ref 1.9–3.7)
Glucose, Bld: 88 mg/dL (ref 65–99)
Potassium: 4.4 mmol/L (ref 3.5–5.3)
Sodium: 137 mmol/L (ref 135–146)
Total Bilirubin: 0.4 mg/dL (ref 0.2–1.2)
Total Protein: 8 g/dL (ref 6.1–8.1)

## 2019-12-16 LAB — CBC WITH DIFFERENTIAL/PLATELET
Absolute Monocytes: 699 cells/uL (ref 200–950)
Basophils Absolute: 38 cells/uL (ref 0–200)
Basophils Relative: 0.6 %
Eosinophils Absolute: 120 cells/uL (ref 15–500)
Eosinophils Relative: 1.9 %
HCT: 44 % (ref 38.5–50.0)
Hemoglobin: 14.6 g/dL (ref 13.2–17.1)
Lymphs Abs: 2772 cells/uL (ref 850–3900)
MCH: 28.5 pg (ref 27.0–33.0)
MCHC: 33.2 g/dL (ref 32.0–36.0)
MCV: 85.8 fL (ref 80.0–100.0)
MPV: 10.2 fL (ref 7.5–12.5)
Monocytes Relative: 11.1 %
Neutro Abs: 2671 cells/uL (ref 1500–7800)
Neutrophils Relative %: 42.4 %
Platelets: 334 10*3/uL (ref 140–400)
RBC: 5.13 10*6/uL (ref 4.20–5.80)
RDW: 13.2 % (ref 11.0–15.0)
Total Lymphocyte: 44 %
WBC: 6.3 10*3/uL (ref 3.8–10.8)

## 2019-12-16 LAB — RPR: RPR Ser Ql: REACTIVE — AB

## 2019-12-16 LAB — FLUORESCENT TREPONEMAL AB(FTA)-IGG-BLD: Fluorescent Treponemal ABS: REACTIVE — AB

## 2019-12-16 LAB — HIV-1 RNA QUANT-NO REFLEX-BLD
HIV 1 RNA Quant: 234 Copies/mL — ABNORMAL HIGH
HIV-1 RNA Quant, Log: 2.37 Log cps/mL — ABNORMAL HIGH

## 2019-12-16 LAB — RPR TITER: RPR Titer: 1:16 {titer} — ABNORMAL HIGH

## 2019-12-16 NOTE — Telephone Encounter (Signed)
-----   Message from Randall Hiss, MD sent at 12/15/2019 10:03 AM EDT ----- PTS titer up 2 fold. Was he treated before when it went to 1:8 from NR

## 2019-12-16 NOTE — Telephone Encounter (Signed)
Attempted to reach patient by phone. Will send MyChart message for patient to contact the office regarding results.  Valarie Cones

## 2019-12-16 NOTE — Telephone Encounter (Signed)
Opened in error

## 2019-12-17 NOTE — Telephone Encounter (Signed)
Attempted to call patient regarding RPR. Will need to ask patient if he has been treated before titer jumped to 1:8. Left voicemail requesting call back.  Jose Darnelle Bos

## 2019-12-20 NOTE — Telephone Encounter (Signed)
Thanks so much. 

## 2019-12-20 NOTE — Telephone Encounter (Signed)
Patient information sent to DIS, unable to reach via MyChart/phone.

## 2019-12-27 ENCOUNTER — Ambulatory Visit (INDEPENDENT_AMBULATORY_CARE_PROVIDER_SITE_OTHER): Admitting: Infectious Disease

## 2019-12-27 ENCOUNTER — Other Ambulatory Visit: Payer: Self-pay

## 2019-12-27 ENCOUNTER — Telehealth: Payer: Self-pay

## 2019-12-27 VITALS — BP 104/68 | HR 83 | Temp 98.9°F

## 2019-12-27 DIAGNOSIS — B20 Human immunodeficiency virus [HIV] disease: Secondary | ICD-10-CM

## 2019-12-27 DIAGNOSIS — K6282 Dysplasia of anus: Secondary | ICD-10-CM

## 2019-12-27 DIAGNOSIS — U071 COVID-19: Secondary | ICD-10-CM | POA: Diagnosis not present

## 2019-12-27 MED ORDER — BIKTARVY 50-200-25 MG PO TABS: 1.0000 | ORAL_TABLET | Freq: Every day | ORAL | 5 refills | Status: DC

## 2019-12-27 NOTE — Telephone Encounter (Signed)
[facility] Patient Advocate Encounter  Completed and sent [employer] Advancing Access application for Biktarvy  for this patient who is uninsured.    Patient is approved 12/27/19 through 03/14/20. I was able to reactivate his Advancing Access until 02/22 after that they will need a denial letter from [employer].   BIN      [id number] PCN    [id number] GRP    [id number] ID        [id number]   Clearance Coots, CPhT Specialty Pharmacy Patient [facility] Phone: [phone] Fax:  [phone]

## 2019-12-27 NOTE — Progress Notes (Signed)
Chief complaint: " I am out of meds"  Subjective:    Patient ID: Scott Butler, male    DOB: Jun 11, 1990, 29 y.o.   MRN: [id number]  HPI  29 year old Caucasian man with HIV that HAD been well controlled on Atripla at [facility] then [place] with [place] later [place].    He has never been terribly viremic either at [facility] or with [place] that I can tell even when off medications.  For example his viral load when he came into care with [place] in April 2017 was 2286 copies the highest viral load and recent history at [facility] back in 2012 was roughly 4000 copies.  He has been off medication since last April now most recent viral load is only in the 200s.  CD4 count is still healthy.  Was hospitalized at [facility] last spring with it was thought to be a calculus cholecystitis but also incidentally found to have COVID-19 infection at that time.     Past Medical History:  Diagnosis Date  . HIV (human immunodeficiency virus infection) (HCC)   . Marijuana use 08/16/2015    Past Surgical History:  Procedure Laterality Date  . removal of anal warts      No family history on file.    Social History   Socioeconomic History  . Marital status: Divorced    Spouse name: Not on file  . Number of children: Not on file  . Years of education: Not on file  . Highest education level: Not on file  Occupational History  . Not on file  Tobacco Use  . Smoking status: Current Every Day Smoker  . Smokeless tobacco: Never Used  Substance and Sexual Activity  . Alcohol use: No  . Drug use: Yes    Types: Marijuana  . Sexual activity: Not on file  Other Topics Concern  . Not on file  Social History Narrative  . Not on file   Social Determinants of Health   Financial Resource Strain:   . Difficulty of Paying Living Expenses: Not on file  Food Insecurity:   . Worried About Programme researcher, broadcasting/film/video in the Last Year: Not on file  . Ran Out of Food in the Last Year: Not on file  Transportation Needs:   . Lack of  Transportation (Medical): Not on file  . Lack of Transportation (Non-Medical): Not on file  Physical Activity:   . Days of Exercise per Week: Not on file  . Minutes of Exercise per Session: Not on file  Stress:   . Feeling of Stress : Not on file  Social Connections:   . Frequency of Communication with Friends and Family: Not on file  . Frequency of Social Gatherings with Friends and Family: Not on file  . Attends Religious Services: Not on file  . Active Member of Clubs or Organizations: Not on file  . Attends Banker Meetings: Not on file  . Marital Status: Not on file    No Known Allergies   Current Outpatient Medications:  .  bictegravir-emtricitabine-tenofovir AF (BIKTARVY) 50-200-25 MG TABS tablet, Take 1 tablet by mouth daily., Disp: 30 tablet, Rfl: 5    Review of Systems  Constitutional: Negative for activity change, appetite change, chills, diaphoresis, fatigue, fever and unexpected weight change.  HENT: Negative for congestion, rhinorrhea, sinus pressure, sneezing, sore throat and trouble swallowing.   Eyes: Negative for photophobia and visual disturbance.  Respiratory: Negative for cough, chest tightness, shortness of breath, wheezing and stridor.  Cardiovascular: Negative for chest pain, palpitations and leg swelling.  Gastrointestinal: Negative for abdominal distention, abdominal pain, anal bleeding, blood in stool, constipation, diarrhea, nausea and vomiting.  Genitourinary: Negative for difficulty urinating, discharge and dysuria.  Musculoskeletal: Negative for back pain, gait problem, joint swelling and myalgias.  Skin: Negative for color change, pallor, rash and wound.  Neurological: Negative for dizziness, tremors, weakness and light-headedness.  Hematological: Negative for adenopathy. Does not bruise/bleed easily.  Psychiatric/Behavioral: Negative for agitation, behavioral problems, confusion, decreased concentration, dysphoric mood, hallucinations  and sleep disturbance.       Objective:   Physical Exam Constitutional:      Appearance: He is well-developed.  HENT:     Head: Normocephalic and atraumatic.  Eyes:     Conjunctiva/sclera: Conjunctivae normal.  Cardiovascular:     Rate and Rhythm: Normal rate and regular rhythm.  Pulmonary:     Effort: Pulmonary effort is normal. No respiratory distress.     Breath sounds: No wheezing.  Abdominal:     General: There is no distension.     Palpations: Abdomen is soft.  Musculoskeletal:        General: No tenderness. Normal range of motion.     Cervical back: Normal range of motion and neck supple.  Skin:    General: Skin is warm and dry.     Coloration: Skin is not pale.     Findings: No erythema or rash.  Neurological:     Mental Status: He is alert and oriented to person, place, and time.  Psychiatric:        Attention and Perception: Attention normal.        Mood and Affect: Mood normal.        Speech: Speech normal.        Behavior: Behavior normal.        Thought Content: Thought content normal.        Cognition and Memory: Cognition normal.        Judgment: Judgment normal.           Assessment & Plan:   HIV disease: renew  Biktarvy ensure HMAP renewed and RTC in 2 months   Anal dysplasia: Will refer to [facility] when he gets out of [place] unless   COVID prevention: Skin to go ahead and get his first dose of [employer] through the jail and then he can have it repeated here in our clinic in 2 months time.

## 2019-12-30 ENCOUNTER — Other Ambulatory Visit: Payer: Self-pay | Admitting: Infectious Disease

## 2019-12-30 DIAGNOSIS — B20 Human immunodeficiency virus [HIV] disease: Secondary | ICD-10-CM

## 2020-01-27 ENCOUNTER — Telehealth: Payer: Self-pay

## 2020-01-27 NOTE — Telephone Encounter (Signed)
Received fax from [facility] requesting medical records. Will fax last office note,labs, immunization record, and medication list.  P: [phone] F: [phone] Lorenso Courier, CMA

## 2020-01-31 NOTE — Telephone Encounter (Signed)
Unable to fax medical records to [phone] fax keep coming back communication error    [facility] at [phone] was advised to mail to : [facility] [place] [facility] [address]  [place] [address]

## 2020-06-09 ENCOUNTER — Ambulatory Visit: Payer: PRIVATE HEALTH INSURANCE

## 2020-06-13 ENCOUNTER — Telehealth: Payer: Self-pay

## 2020-06-13 NOTE — Telephone Encounter (Signed)
Called patient to get some appointments scheduled, both numbers are no longer in service.

## 2020-07-24 ENCOUNTER — Other Ambulatory Visit: Payer: Self-pay | Admitting: Family

## 2020-07-24 DIAGNOSIS — B20 Human immunodeficiency virus [HIV] disease: Secondary | ICD-10-CM

## 2020-07-24 NOTE — Progress Notes (Unsigned)
om

## 2020-10-10 ENCOUNTER — Telehealth: Payer: Self-pay

## 2020-10-10 NOTE — Telephone Encounter (Signed)
Called patient to get some appointments scheduled, left a voicemail for patient to call us back and get scheduled

## 2020-10-19 ENCOUNTER — Other Ambulatory Visit: Payer: Self-pay

## 2020-10-19 ENCOUNTER — Ambulatory Visit: Payer: Self-pay

## 2020-10-19 DIAGNOSIS — B20 Human immunodeficiency virus [HIV] disease: Secondary | ICD-10-CM

## 2020-10-19 DIAGNOSIS — Z113 Encounter for screening for infections with a predominantly sexual mode of transmission: Secondary | ICD-10-CM

## 2020-10-19 DIAGNOSIS — Z79899 Other long term (current) drug therapy: Secondary | ICD-10-CM

## 2020-10-20 LAB — T-HELPER CELL (CD4) - (RCID CLINIC ONLY)
CD4 % Helper T Cell: 34 % (ref 33–65)
CD4 T Cell Abs: 1023 /uL (ref 400–1790)

## 2020-10-23 LAB — CBC WITH DIFFERENTIAL/PLATELET
Absolute Monocytes: 607 cells/uL (ref 200–950)
Basophils Absolute: 40 cells/uL (ref 0–200)
Basophils Relative: 0.6 %
Eosinophils Absolute: 119 cells/uL (ref 15–500)
Eosinophils Relative: 1.8 %
HCT: 41.4 % (ref 38.5–50.0)
Hemoglobin: 13.3 g/dL (ref 13.2–17.1)
Lymphs Abs: 3214 cells/uL (ref 850–3900)
MCH: 27.5 pg (ref 27.0–33.0)
MCHC: 32.1 g/dL (ref 32.0–36.0)
MCV: 85.5 fL (ref 80.0–100.0)
MPV: 10.2 fL (ref 7.5–12.5)
Monocytes Relative: 9.2 %
Neutro Abs: 2620 cells/uL (ref 1500–7800)
Neutrophils Relative %: 39.7 %
Platelets: 354 10*3/uL (ref 140–400)
RBC: 4.84 10*6/uL (ref 4.20–5.80)
RDW: 13.2 % (ref 11.0–15.0)
Total Lymphocyte: 48.7 %
WBC: 6.6 10*3/uL (ref 3.8–10.8)

## 2020-10-23 LAB — HIV-1 RNA QUANT-NO REFLEX-BLD
HIV 1 RNA Quant: 251 Copies/mL — ABNORMAL HIGH
HIV-1 RNA Quant, Log: 2.4 Log cps/mL — ABNORMAL HIGH

## 2020-10-23 LAB — LIPID PANEL
Cholesterol: 125 mg/dL (ref <200)
HDL: 56 mg/dL (ref >=40)
LDL Cholesterol (Calc): 50 mg/dL (calc)
Non-HDL Cholesterol (Calc): 69 mg/dL (calc) (ref <130)
Total CHOL/HDL Ratio: 2.2 (calc) (ref <5.0)
Triglycerides: 109 mg/dL (ref <150)

## 2020-10-23 LAB — COMPLETE METABOLIC PANEL WITH GFR
AG Ratio: 1.5 (calc) (ref 1.0–2.5)
ALT: 13 U/L (ref 9–46)
AST: 23 U/L (ref 10–40)
Albumin: 4.2 g/dL (ref 3.6–5.1)
Alkaline phosphatase (APISO): 69 U/L (ref 36–130)
BUN: 9 mg/dL (ref 7–25)
CO2: 27 mmol/L (ref 20–32)
Calcium: 9.3 mg/dL (ref 8.6–10.3)
Chloride: 105 mmol/L (ref 98–110)
Creat: 0.87 mg/dL (ref 0.60–1.26)
Globulin: 2.8 g/dL (calc) (ref 1.9–3.7)
Glucose, Bld: 151 mg/dL — ABNORMAL HIGH (ref 65–99)
Potassium: 3.7 mmol/L (ref 3.5–5.3)
Sodium: 140 mmol/L (ref 135–146)
Total Bilirubin: 0.3 mg/dL (ref 0.2–1.2)
Total Protein: 7 g/dL (ref 6.1–8.1)
eGFR: 118 mL/min/{1.73_m2} (ref >=60)

## 2020-10-23 LAB — URINE CYTOLOGY ANCILLARY ONLY
Chlamydia: NEGATIVE
Comment: NEGATIVE
Comment: NORMAL
Neisseria Gonorrhea: NEGATIVE

## 2020-10-23 LAB — RPR TITER: RPR Titer: 1:2 {titer} — ABNORMAL HIGH

## 2020-10-23 LAB — FLUORESCENT TREPONEMAL AB(FTA)-IGG-BLD: Fluorescent Treponemal ABS: REACTIVE — AB

## 2020-10-23 LAB — RPR: RPR Ser Ql: REACTIVE — AB

## 2020-10-24 ENCOUNTER — Other Ambulatory Visit: Payer: Self-pay | Admitting: Pharmacist

## 2020-10-24 DIAGNOSIS — B20 Human immunodeficiency virus [HIV] disease: Secondary | ICD-10-CM

## 2020-10-24 MED ORDER — BICTEGRAVIR-EMTRICITAB-TENOFOV 50-200-25 MG PO TABS: 1.0000 | ORAL_TABLET | Freq: Every day | ORAL | 0 refills | Status: AC

## 2020-10-24 NOTE — Progress Notes (Signed)
Medication Samples have been provided to the patient.  Drug name: Biktarvy        Strength: 50/200/25 mg       Qty: 28 tablets (4 bottles) LOT: CHSYVB   Exp.Date: 6/24  Dosing instructions: Take one tablet by mouth once daily  The patient has been instructed regarding the correct time, dose, and frequency of taking this medication, including desired effects and most common side effects.   Matison Nuccio, PharmD, CPP Clinical Pharmacist Practitioner Infectious Diseases Clinical Pharmacist [facility]  

## 2020-11-01 ENCOUNTER — Encounter: Payer: Self-pay | Admitting: Infectious Disease

## 2020-11-08 ENCOUNTER — Ambulatory Visit: Payer: Self-pay | Admitting: Infectious Disease

## 2020-11-20 ENCOUNTER — Emergency Department
Admission: EM | Admit: 2020-11-20 | Discharge: 2020-11-20 | Disposition: A | Discharge status: Home / Self Care | Attending: Emergency Medicine | Admitting: Emergency Medicine

## 2020-11-20 ENCOUNTER — Other Ambulatory Visit: Payer: Self-pay

## 2020-11-20 DIAGNOSIS — Z79899 Other long term (current) drug therapy: Secondary | ICD-10-CM | POA: Diagnosis not present

## 2020-11-20 DIAGNOSIS — Z21 Asymptomatic human immunodeficiency virus [HIV] infection status: Secondary | ICD-10-CM | POA: Insufficient documentation

## 2020-11-20 DIAGNOSIS — Z8616 Personal history of COVID-19: Secondary | ICD-10-CM | POA: Insufficient documentation

## 2020-11-20 DIAGNOSIS — S01511A Laceration without foreign body of lip, initial encounter: Secondary | ICD-10-CM | POA: Diagnosis not present

## 2020-11-20 DIAGNOSIS — F17200 Nicotine dependence, unspecified, uncomplicated: Secondary | ICD-10-CM | POA: Diagnosis not present

## 2020-11-20 DIAGNOSIS — S0993XA Unspecified injury of face, initial encounter: Secondary | ICD-10-CM | POA: Diagnosis present

## 2020-11-20 MED ORDER — TETANUS-DIPHTH-ACELL PERTUSSIS 5-2.5-18.5 LF-MCG/0.5 IM SUSY
0.5000 mL | PREFILLED_SYRINGE | Freq: Once | INTRAMUSCULAR | Status: DC
  Filled 2020-11-20: qty 0.5
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
## 2020-11-20 NOTE — Discharge Instructions (Addendum)
As we discussed, we were able to close the laceration on your lip with Dermabond (skin adhesive).  We recommend you try to stick with soft foods or liquids for at least a week until the wound heals.  The skin glue will fall off on its own over time; please do not pull out it because it could reopen the wound.  Even if it does reopen, it will heal on its own, but it will take longer to do so if it reopens by accident.

## 2020-12-04 ENCOUNTER — Telehealth: Payer: Self-pay

## 2020-12-04 NOTE — Telephone Encounter (Signed)
Patient in custody at [facility], RN spoke with medical there who state that since patient was off medication when he was brought into custody that they cannot schedule a follow up appointment until he has been in custody for 6 months.   RN called back to speak with a supervisor, Lt. [place] states that should not be the case and will either call our office back or have her supervisor contact us in regards to scheduling patient.   Sandie Ano, RN

## 2021-07-03 ENCOUNTER — Ambulatory Visit: Payer: Self-pay | Admitting: Infectious Disease

## 2021-08-16 ENCOUNTER — Ambulatory Visit: Payer: Self-pay | Admitting: Family

## 2021-08-21 ENCOUNTER — Ambulatory Visit: Payer: Self-pay | Admitting: Family

## 2021-08-22 ENCOUNTER — Ambulatory Visit: Payer: Self-pay

## 2021-08-22 ENCOUNTER — Encounter: Payer: Self-pay | Admitting: Family

## 2021-08-22 ENCOUNTER — Other Ambulatory Visit: Payer: Self-pay

## 2021-08-22 ENCOUNTER — Ambulatory Visit (INDEPENDENT_AMBULATORY_CARE_PROVIDER_SITE_OTHER): Admitting: Family

## 2021-08-22 VITALS — BP 114/75 | HR 116 | Temp 99.4°F | Wt 135.0 lb

## 2021-08-22 DIAGNOSIS — Z0000 Encounter for general adult medical examination without abnormal findings: Secondary | ICD-10-CM | POA: Insufficient documentation

## 2021-08-22 DIAGNOSIS — K120 Recurrent oral aphthae: Secondary | ICD-10-CM | POA: Diagnosis not present

## 2021-08-22 DIAGNOSIS — Z113 Encounter for screening for infections with a predominantly sexual mode of transmission: Secondary | ICD-10-CM

## 2021-08-22 DIAGNOSIS — B20 Human immunodeficiency virus [HIV] disease: Secondary | ICD-10-CM

## 2021-08-22 DIAGNOSIS — Z79899 Other long term (current) drug therapy: Secondary | ICD-10-CM | POA: Diagnosis not present

## 2021-08-22 MED ORDER — BIKTARVY 50-200-25 MG PO TABS: 1.0000 | ORAL_TABLET | Freq: Every day | ORAL | 5 refills | Status: AC

## 2021-08-22 NOTE — Assessment & Plan Note (Signed)
Lesion on left side of tongue appears consistent with canker sore or possible small blister where he may have bitten his tongue.  Treat conservatively as needed.  Follow-up if symptoms worsen or do not improve.

## 2021-08-22 NOTE — Patient Instructions (Addendum)
Nice to see you.  We will check your lab work today.  Continue to take your medication daily as prescribed.  Please call [facility]) to schedule/follow up on your dental care at [phone]  Refills have been sent to the pharmacy.  Plan for follow up with Dr. Daiva Eves  in 3 months or sooner if needed with lab work on the same day.  Have a great day and stay safe!

## 2021-08-22 NOTE — Assessment & Plan Note (Signed)
Mr. Scott Butler appears to have adequately controlled virus with good adherence and tolerance to Biktarvy.  Discussed importance of taking medication daily as prescribed.  Financial assistance renewed today.  Check lab work.  Continue current dose of Biktarvy.  Plan for follow-up in 3 months or sooner if needed with lab work on the same day.

## 2021-08-22 NOTE — Progress Notes (Signed)
Patient ID: Scott Butler, male    DOB: March 05, 1990, 31 y.o.   MRN: [id number]  Subjective:    Chief Complaint  Patient presents with   Follow-up    Missed one dose of Biktarvy,     HPI:  Scott Butler is a 31 y.o. male with HIV disease last seen on 12/27/2019 with less than optimal adherence and good tolerance to his ART regimen of Biktarvy.  Has been off medication for several months and noted to have viral load of 234 with CD4 count of 678.  Most recent blood work completed on 10/19/2020 with CD4 count of 1023 and viral load of 251.  Here today for routine follow-up.  Scott Butler has been taking his Biktarvy with a couple of missed doses since being released from prison about 3 months ago where he did receive his medication on a daily basis.  Feeling well today with concern for the left side of his tongue he feels as though there may be a lesion present.  Denies any injury or trauma. Denies fevers, chills, night sweats, headaches, changes in vision, neck pain/stiffness, nausea, diarrhea, vomiting, lesions or rashes.  Scott Butler renewed his financial assistance.  Denies feelings of being down, depressed, or hopeless recently.  No current alcohol consumption with marijuana use and daily tobacco use.  Condoms offered.  Requesting STD testing.  Due for tetanus vaccination and pneumococcal vaccine.   No Known Allergies    Outpatient Medications Prior to Visit  Medication Sig Dispense Refill   bictegravir-emtricitabine-tenofovir AF (BIKTARVY) 50-200-25 MG TABS tablet Take 1 tablet by mouth daily. (Patient not taking: Reported on 08/22/2021) 30 tablet 5   No facility-administered medications prior to visit.     Past Medical History:  Diagnosis Date   HIV (human immunodeficiency virus infection) (HCC)    Marijuana use 08/16/2015     Past Surgical History:  Procedure Laterality Date   removal of anal warts        Review of Systems  Constitutional:  Negative for appetite  change, chills, fatigue, fever and unexpected weight change.  HENT:         Left-sided tongue lesion  Eyes:  Negative for visual disturbance.  Respiratory:  Negative for cough, chest tightness, shortness of breath and wheezing.   Cardiovascular:  Negative for chest pain and leg swelling.  Gastrointestinal:  Negative for abdominal pain, constipation, diarrhea, nausea and vomiting.  Genitourinary:  Negative for dysuria, flank pain, frequency, genital sores, hematuria and urgency.  Skin:  Negative for rash.  Allergic/Immunologic: Negative for immunocompromised state.  Neurological:  Negative for dizziness and headaches.      Objective:    BP 114/75   Pulse (!) 116   Temp 99.4 F (37.4 C) (Oral)   Wt 135 lb (61.2 kg)   SpO2 97%   BMI 20.53 kg/m  Nursing note and vital signs reviewed.  Physical Exam Constitutional:      General: He is not in acute distress.    Appearance: He is well-developed.  HENT:     Mouth/Throat:     Comments: Small lesion located on left side of the tongue appears consistent with canker sore. Eyes:     Conjunctiva/sclera: Conjunctivae normal.  Cardiovascular:     Rate and Rhythm: Normal rate and regular rhythm.     Heart sounds: Normal heart sounds. No murmur heard.    No friction rub. No gallop.  Pulmonary:     Effort: Pulmonary effort is normal. No  respiratory distress.     Breath sounds: Normal breath sounds. No wheezing or rales.  Chest:     Chest wall: No tenderness.  Abdominal:     General: Bowel sounds are normal.     Palpations: Abdomen is soft.     Tenderness: There is no abdominal tenderness.  Musculoskeletal:     Cervical back: Neck supple.  Lymphadenopathy:     Cervical: No cervical adenopathy.  Skin:    General: Skin is warm and dry.     Findings: No rash.  Neurological:     Mental Status: He is alert and oriented to person, place, and time.  Psychiatric:        Behavior: Behavior normal.        Thought Content: Thought content  normal.        Judgment: Judgment normal.         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  Treat conservatively as needed.  Follow-up if symptoms worsen or do not improve.        Other   HIV disease (HCC) - Primary    [name] appears to have adequately controlled virus with good adherence and tolerance to [employer].  Discussed importance of taking medication daily as prescribed.  Financial assistance renewed today.  Check lab work.  Continue current dose of Biktarvy.  Plan for follow-up in 3 months or sooner if needed with lab work on the same day.      Relevant Medications   bictegravir-emtricitabine-tenofovir AF (BIKTARVY) 50-200-25 MG TABS tablet   Other Relevant Orders   Comprehensive metabolic panel   HIV-1 RNA quant-no reflex-bld   T-helper cell (CD4)- ([facility] clinic only)   Healthcare maintenance    Discussed importance of safe sexual practice and condom use including family-planning.  Condoms offered. Declines vaccinations. Requesting STD testing. Due for routine dental care with information provided in after visit summary to make appointment.      Other Visit Diagnoses     Screening for STDs (sexually  transmitted diseases)       Relevant Orders   RPR   Urine cytology ancillary only   Cytology (oral, anal, urethral) ancillary only   Cytology (oral, anal, urethral) ancillary only   Pharmacologic therapy       Relevant Orders   Lipid panel        I am having Alp A. Alphonzo Lemmings "Roe Coombs" maintain his Biktarvy.   Meds ordered this encounter  Medications   bictegravir-emtricitabine-tenofovir AF (BIKTARVY) 50-200-25 MG TABS tablet  Sig: Take 1 tablet by mouth daily.    Dispense:  30 tablet    Refill:  5    Please fill once UMAP is approved.    Order Specific Question:   Supervising Provider    Answer:   Judyann Munson [[id number]]     Follow-up: Return in about 3 months (around 11/22/2021), or if symptoms worsen or fail to improve.   Marcos Eke, MSN, FNP-C Nurse Practitioner [facility] Main number: [phone]

## 2021-08-22 NOTE — Assessment & Plan Note (Signed)
   Discussed importance of safe sexual practice and condom use including family-planning.  Condoms offered.  Declines vaccinations.  Requesting STD testing.  Due for routine dental care with information provided in after visit summary to make appointment.

## 2021-08-23 ENCOUNTER — Other Ambulatory Visit: Payer: Self-pay | Admitting: Pharmacist

## 2021-08-23 DIAGNOSIS — B20 Human immunodeficiency virus [HIV] disease: Secondary | ICD-10-CM

## 2021-08-23 LAB — T-HELPER CELL (CD4) - (RCID CLINIC ONLY)
CD4 % Helper T Cell: 36 % (ref 33–65)
CD4 T Cell Abs: 691 /uL (ref 400–1790)

## 2021-08-23 MED ORDER — BICTEGRAVIR-EMTRICITAB-TENOFOV 50-200-25 MG PO TABS: 1.0000 | ORAL_TABLET | Freq: Every day | ORAL | 0 refills | Status: AC

## 2021-08-23 NOTE — Progress Notes (Signed)
Medication Samples have been provided to the patient.  Drug name: Biktarvy        Strength: 50/200/25 mg       Qty: 14 tablets (2 bottles) LOT: [id number]   Exp.Date: 9/25  Dosing instructions: Take one tablet by mouth once daily  The patient has been instructed regarding the correct time, dose, and frequency of taking this medication, including desired effects and most common side effects.   Anika Shore, PharmD, CPP, BCIDP Clinical Pharmacist Practitioner Infectious Diseases Clinical Pharmacist [facility]  

## 2021-08-24 LAB — CYTOLOGY, (ORAL, ANAL, URETHRAL) ANCILLARY ONLY
Chlamydia: NEGATIVE
Chlamydia: NEGATIVE
Comment: NEGATIVE
Comment: NEGATIVE
Comment: NORMAL
Comment: NORMAL
Neisseria Gonorrhea: NEGATIVE
Neisseria Gonorrhea: NEGATIVE

## 2021-08-24 LAB — URINE CYTOLOGY ANCILLARY ONLY
Chlamydia: NEGATIVE
Comment: NEGATIVE
Comment: NORMAL
Neisseria Gonorrhea: NEGATIVE

## 2021-08-25 LAB — LIPID PANEL
Cholesterol: 138 mg/dL (ref <200)
HDL: 58 mg/dL (ref >=40)
LDL Cholesterol (Calc): 59 mg/dL (calc)
Non-HDL Cholesterol (Calc): 80 mg/dL (calc) (ref <130)
Total CHOL/HDL Ratio: 2.4 (calc) (ref <5.0)
Triglycerides: 131 mg/dL (ref <150)

## 2021-08-25 LAB — COMPREHENSIVE METABOLIC PANEL
AG Ratio: 1.8 (calc) (ref 1.0–2.5)
ALT: 9 U/L (ref 9–46)
AST: 15 U/L (ref 10–40)
Albumin: 4.6 g/dL (ref 3.6–5.1)
Alkaline phosphatase (APISO): 69 U/L (ref 36–130)
BUN: 9 mg/dL (ref 7–25)
CO2: 27 mmol/L (ref 20–32)
Calcium: 9.7 mg/dL (ref 8.6–10.3)
Chloride: 105 mmol/L (ref 98–110)
Creat: 1.19 mg/dL (ref 0.60–1.26)
Globulin: 2.6 g/dL (calc) (ref 1.9–3.7)
Glucose, Bld: 98 mg/dL (ref 65–99)
Potassium: 3.7 mmol/L (ref 3.5–5.3)
Sodium: 141 mmol/L (ref 135–146)
Total Bilirubin: 0.6 mg/dL (ref 0.2–1.2)
Total Protein: 7.2 g/dL (ref 6.1–8.1)

## 2021-08-25 LAB — HIV-1 RNA QUANT-NO REFLEX-BLD
HIV 1 RNA Quant: NOT DETECTED Copies/mL
HIV-1 RNA Quant, Log: NOT DETECTED Log cps/mL

## 2021-08-25 LAB — RPR TITER: RPR Titer: 1:2 {titer} — ABNORMAL HIGH

## 2021-08-25 LAB — FLUORESCENT TREPONEMAL AB(FTA)-IGG-BLD: Fluorescent Treponemal ABS: REACTIVE — AB

## 2021-08-25 LAB — RPR: RPR Ser Ql: REACTIVE — AB

## 2021-09-04 ENCOUNTER — Telehealth: Payer: Self-pay

## 2021-09-04 NOTE — Telephone Encounter (Signed)
Received voicemail from [employer] requesting new prescription for [employer].   Called [facility] and spoke with Nate, he states that Roe Coombs has two profiles. Provided him with verbal authorization to refill Biktarvy per 08/22/21 prescription and advised that patient has HMAP.   Sandie Ano, RN

## 2021-11-29 ENCOUNTER — Ambulatory Visit: Admitting: Infectious Disease

## 2022-03-04 IMAGING — CT CT ABD-PELV W/ CM
2 of 4 series · 16 of 46 positions shown, 18 images · IV contrast (APPLIED)
Comparison: None.

CLINICAL DATA: Abdominal pain, history of XIYSD-LY positivity

EXAM:
CT ABDOMEN AND PELVIS WITH CONTRAST
TECHNIQUE: Multidetector CT imaging of the abdomen and pelvis was performed
using the standard protocol following bolus administration of
intravenous contrast.
CONTRAST:  100mL OMNIPAQUE IOHEXOL 300 MG/ML  SOLN

[Series 3: abd/ pelvis 5.0 i30f 2 · axial · 0.78mm/px · z∈[+654,+1104]mm · 13 of 98 slices shown, 15 images]
[im 4/98  soft-tissue]
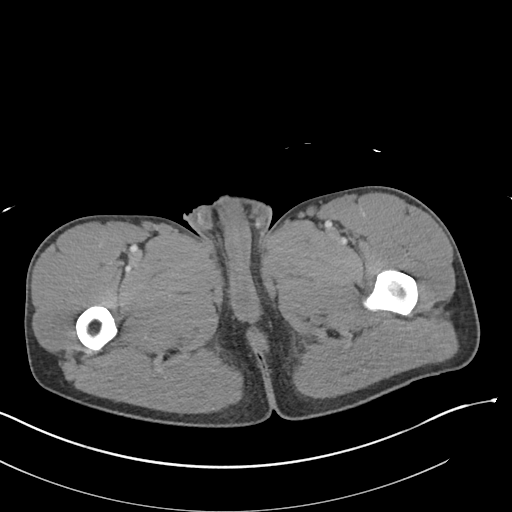
[im 4/98  bone]
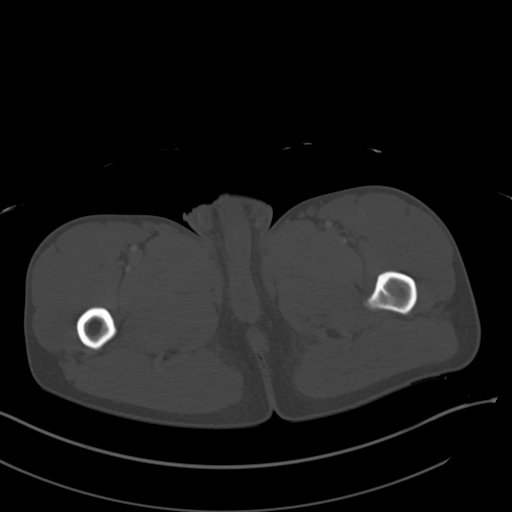
[im 12/98  soft-tissue]
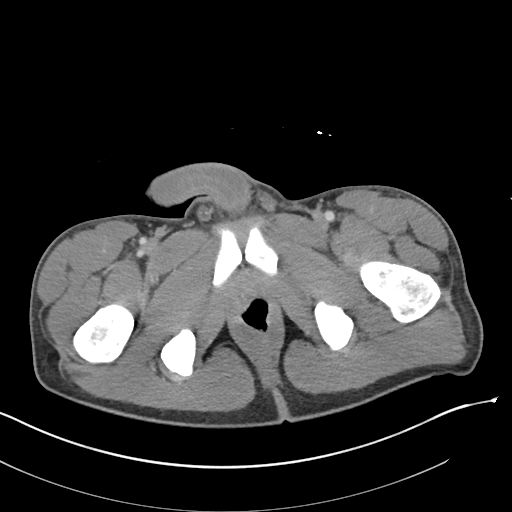
[im 20/98  soft-tissue]
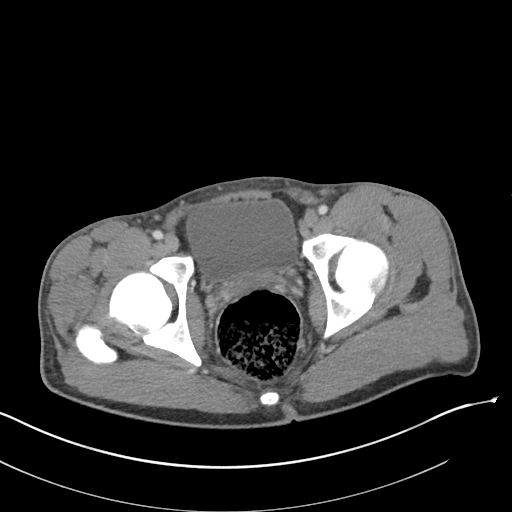
[im 28/98  soft-tissue]
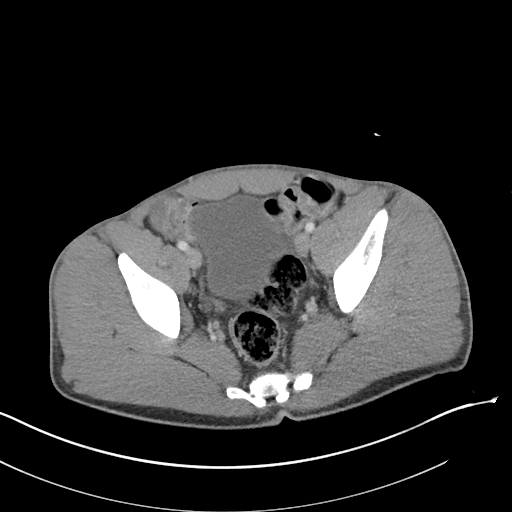
[im 35/98  soft-tissue]
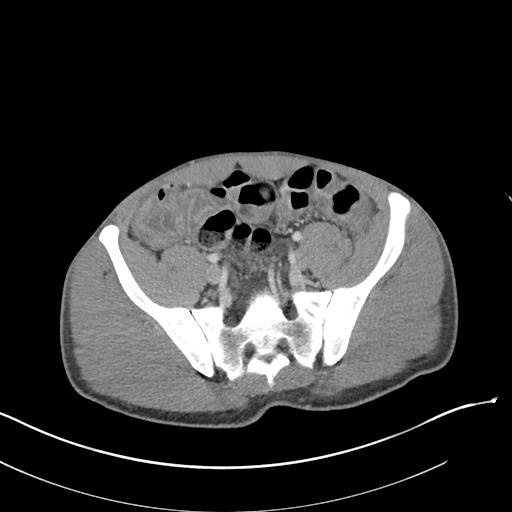
[im 43/98  soft-tissue]
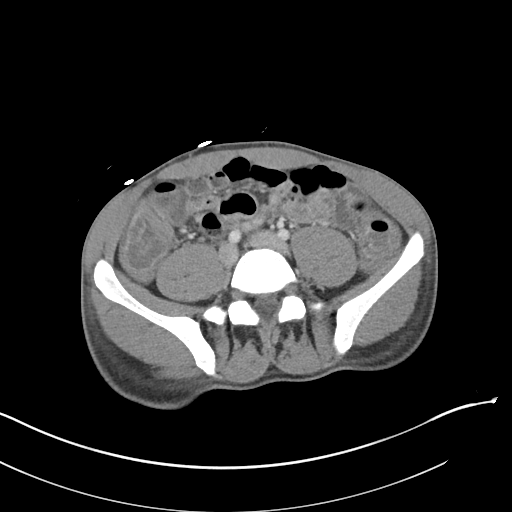
[im 51/98  soft-tissue]
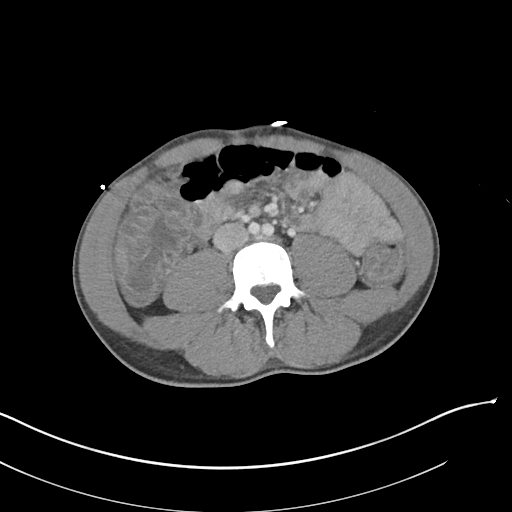
[im 55/98  soft-tissue]
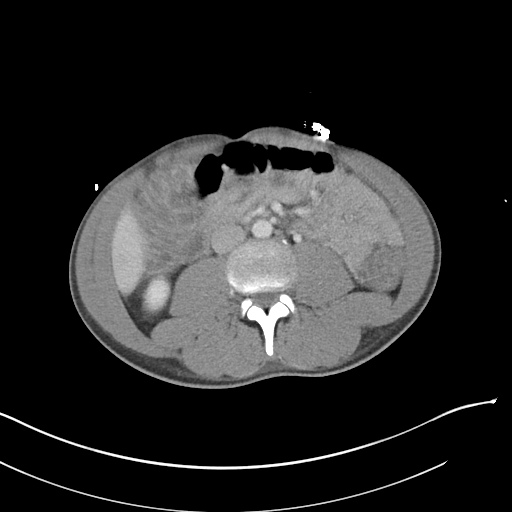
[im 63/98  soft-tissue]
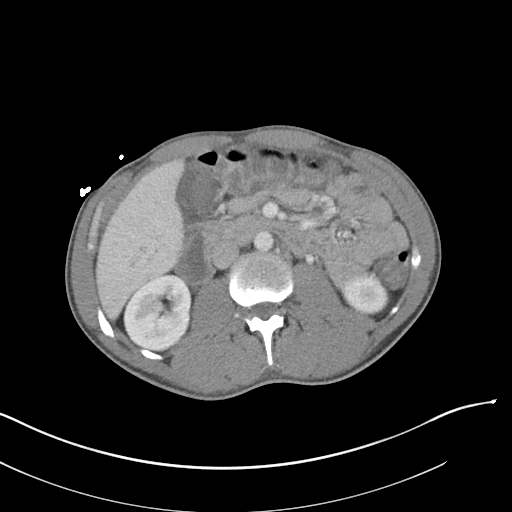
[im 63/98  bone]
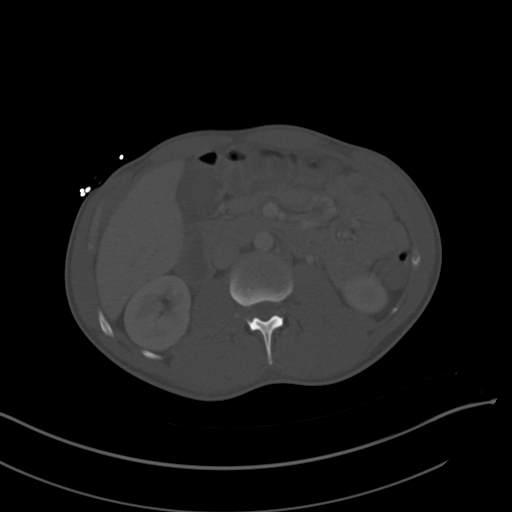
[im 70/98  soft-tissue]
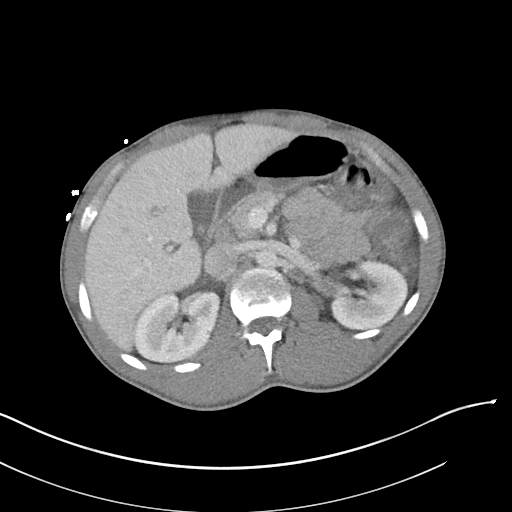
[im 78/98  soft-tissue]
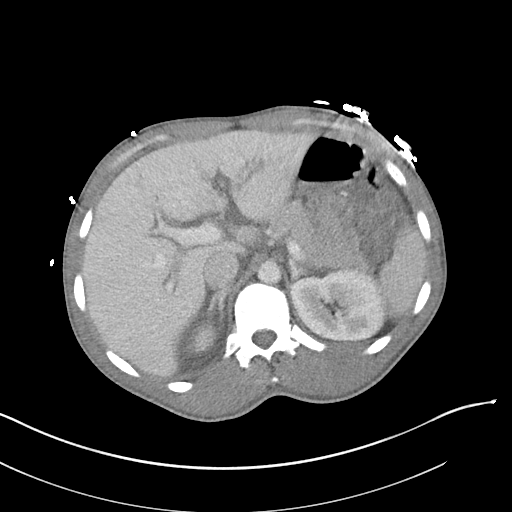
[im 86/98  soft-tissue]
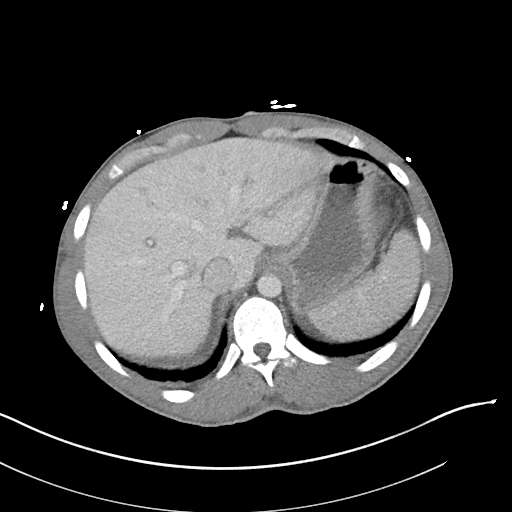
[im 94/98  soft-tissue]
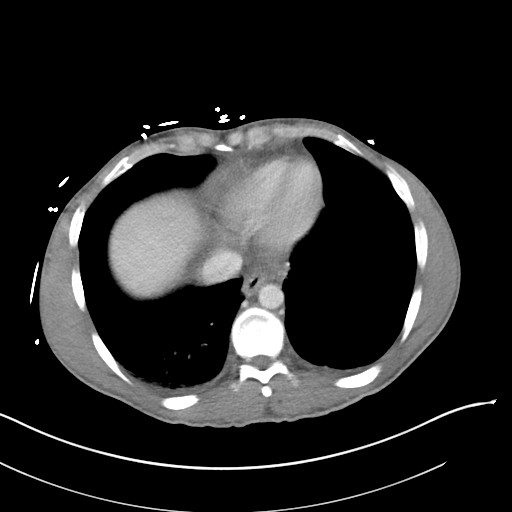

[Series 6: coronal soft tissue · coronal · 0.73mm/px · 3 of 87 slices shown]
[im 29/87  soft-tissue]
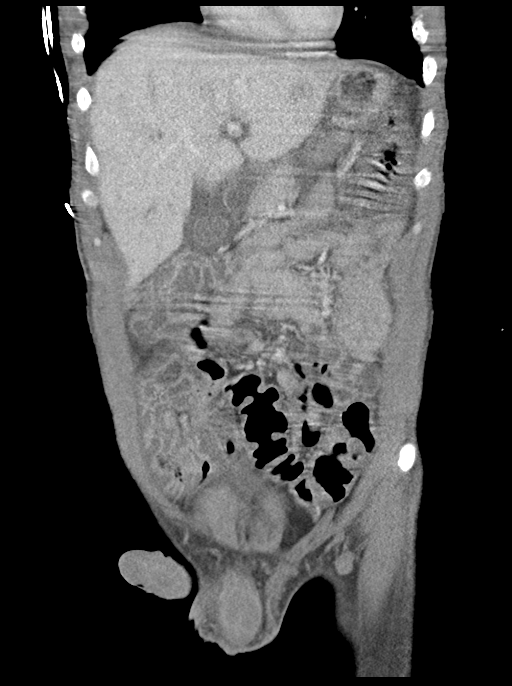
[im 39/87  soft-tissue]
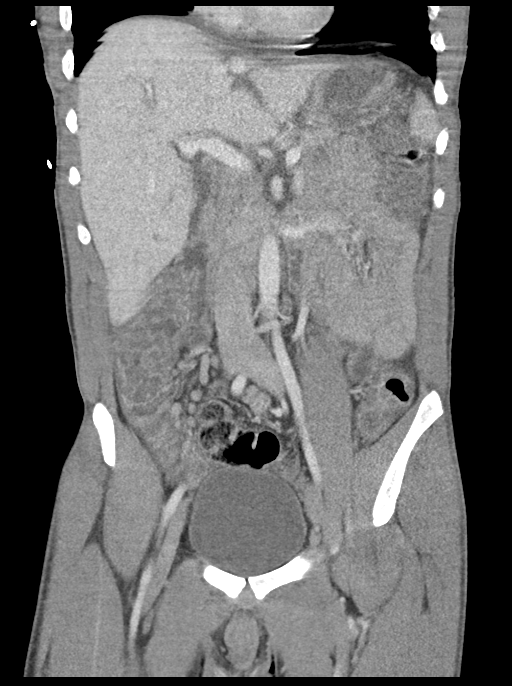
[im 48/87  soft-tissue]
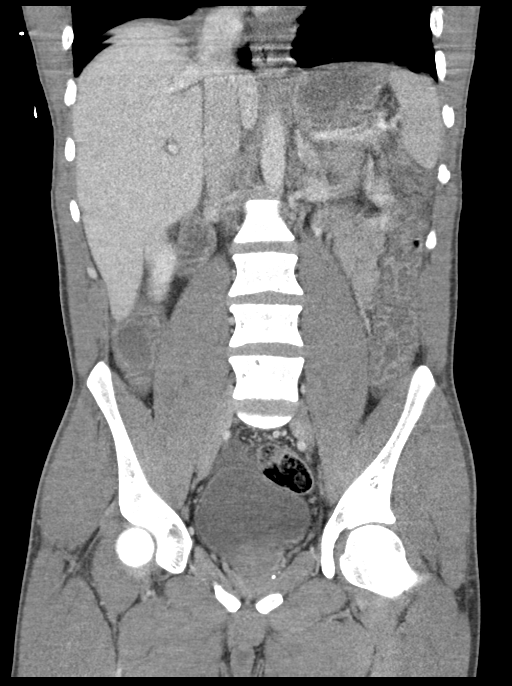

[16 of 46 positions shown; findings below may reference images not displayed]

FINDINGS: Lower chest: Lung bases demonstrate mild right basilar atelectatic
changes.

Hepatobiliary: Liver demonstrates periportal hypodensity consistent
with acute hepatic inflammatory change. The gallbladder is well
distended with fluid surrounding the gallbladder suspicious for
acute cholecystitis.

Pancreas: Unremarkable. No pancreatic ductal dilatation or
surrounding inflammatory changes.

Spleen: Normal in size without focal abnormality.

Adrenals/Urinary Tract: Adrenal glands are within normal limits.
Kidneys demonstrate a normal enhancement pattern with the exception
of a lower pole cyst on the left. No renal calculi or obstructive
changes are seen. Bladder is well distended.

Stomach/Bowel: Stomach is distended with fluid. No obstructive
changes of the small bowel are noted. Mild wall thickening is noted
within the colon which may be related to incomplete distension
although the possibility of a mild enteritis could not be totally
excluded.

Vascular/Lymphatic: No significant vascular findings are present. No
enlarged abdominal or pelvic lymph nodes.

Reproductive: Prostate is unremarkable.

Other: No abdominal wall hernia or abnormality. No abdominopelvic
ascites.

Musculoskeletal: No acute or significant osseous findings.
IMPRESSION: Mild wall thickening within the colon suspicious for a mild
enteritis.

Periportal edema consistent with hepatic inflammatory change.

There are changes suspicious for acute cholecystitis. Ultrasound may
be helpful for further evaluation.

## 2022-03-04 IMAGING — US US ABDOMEN LIMITED
1 series · 14 of 25 positions shown · non-contrast
Comparison: None.

CLINICAL DATA: Abdominal pain

EXAM:
ULTRASOUND ABDOMEN LIMITED RIGHT UPPER QUADRANT

[Series 1: us abdomen limited · 53 acquisitions, 14 frames shown]
[im 1/53]
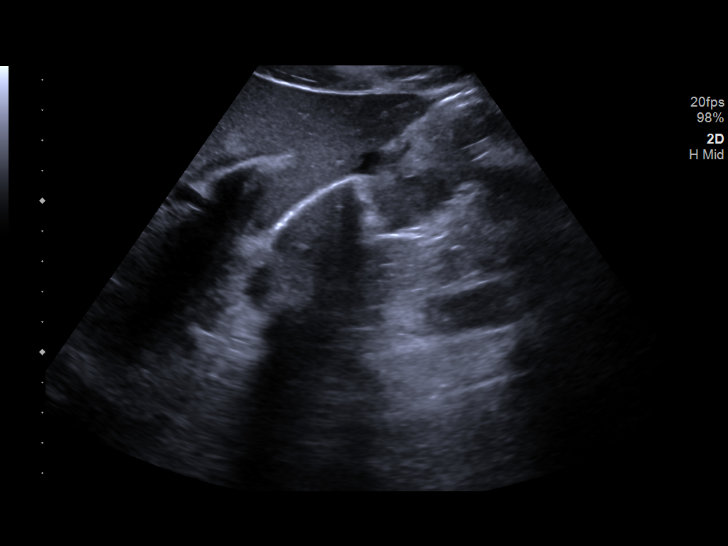
[im 5/53]
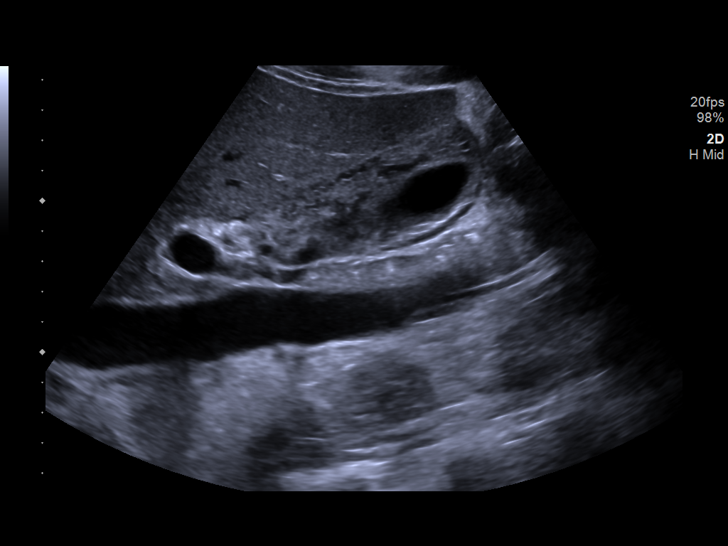
[im 9/53]
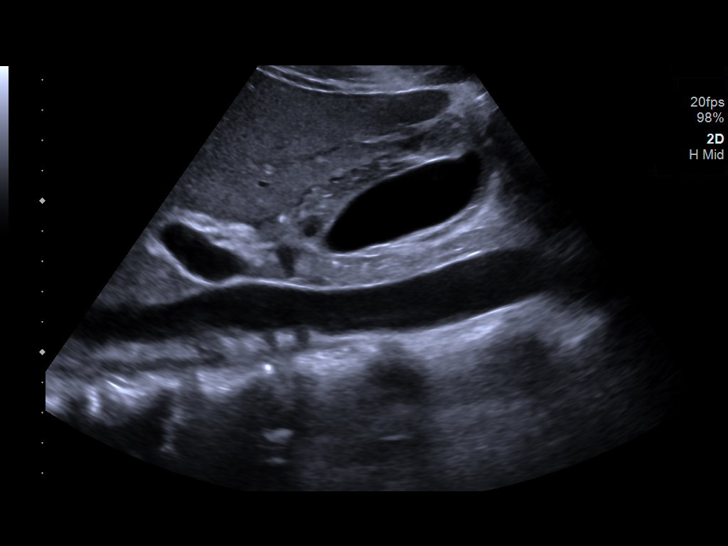
[im 14/53]
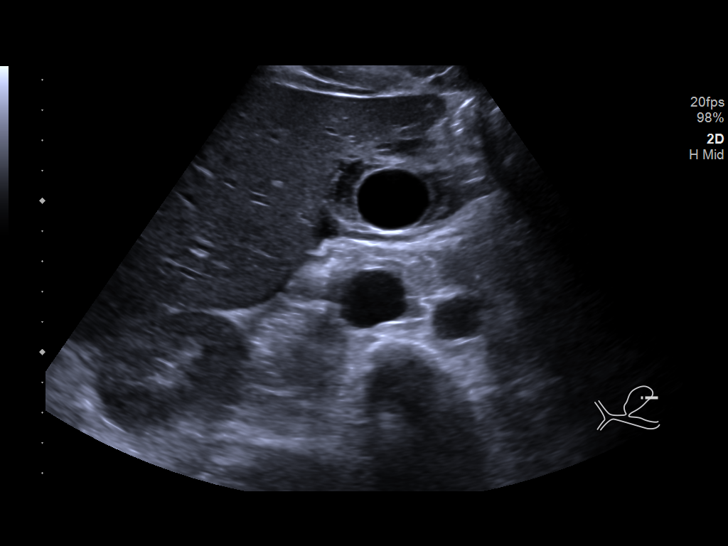
[im 18/53]
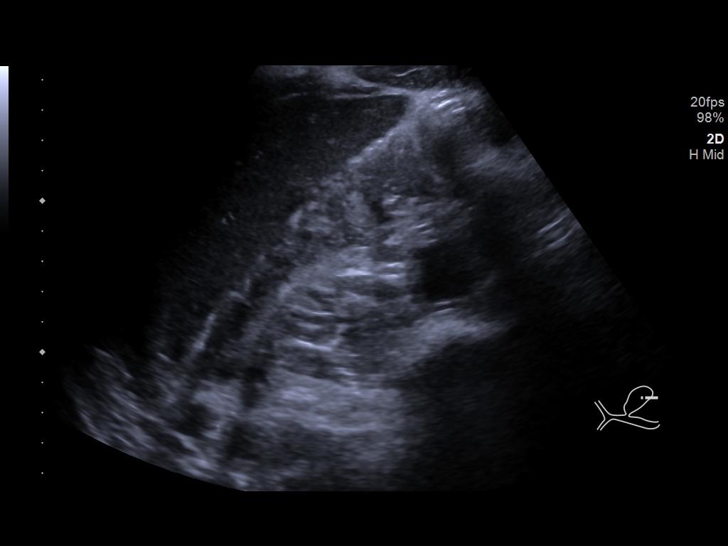
[im 20/53]
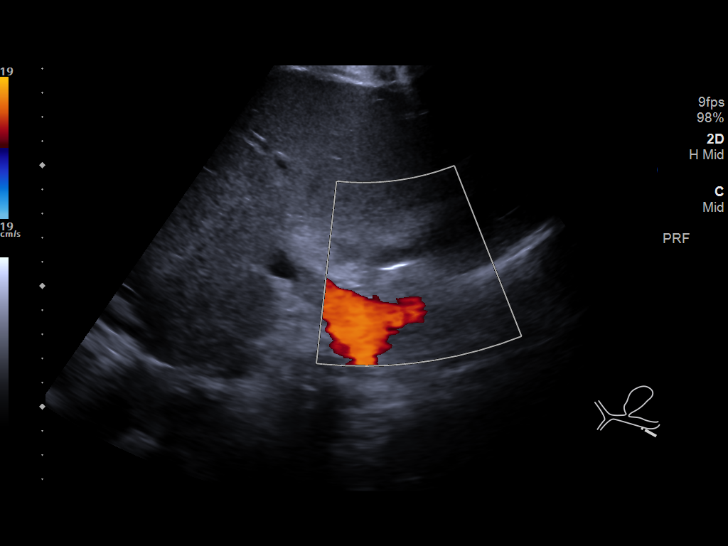
[im 24/53]
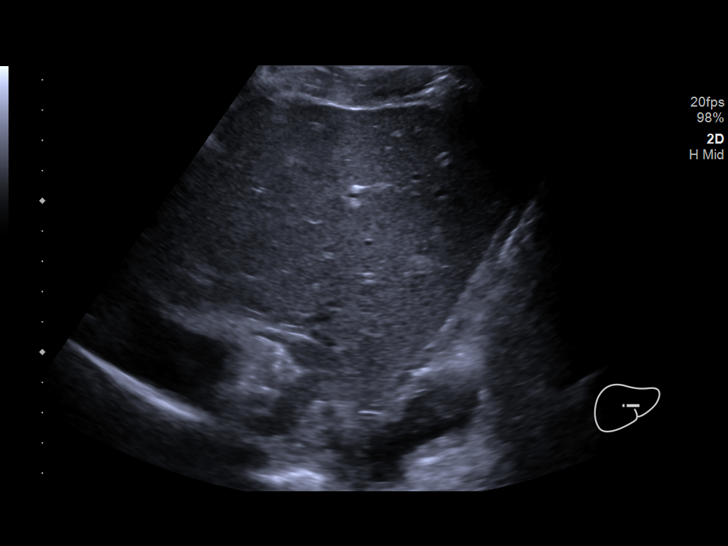
[im 29/53]
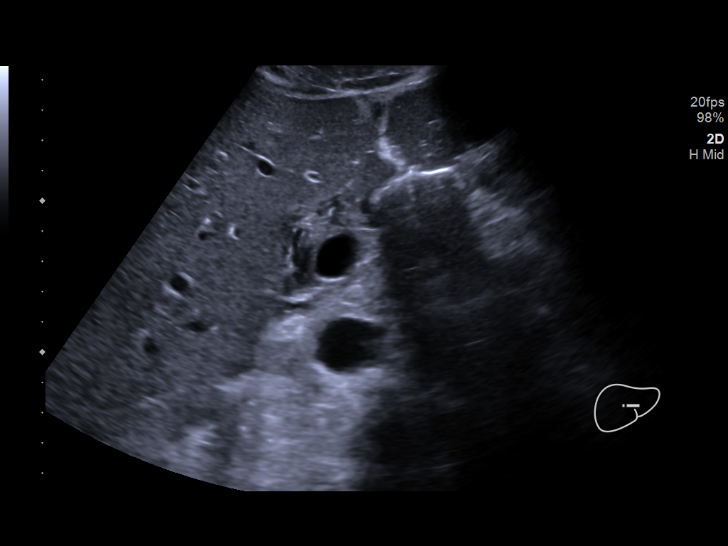
[im 33/53]
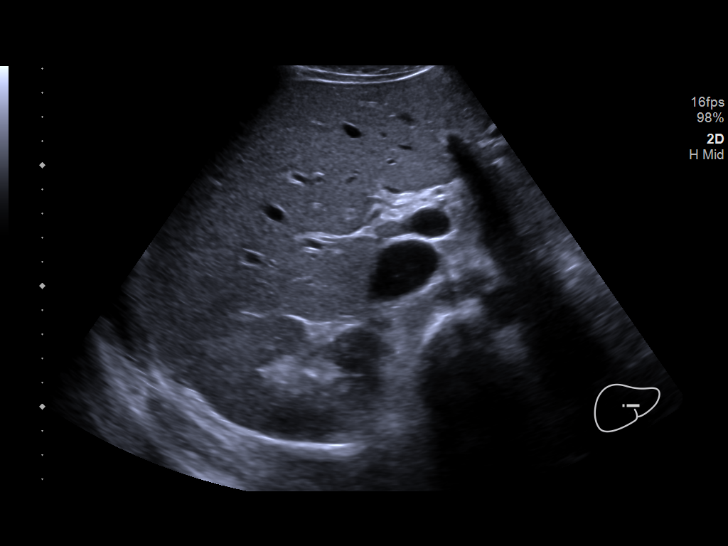
[im 35/53]
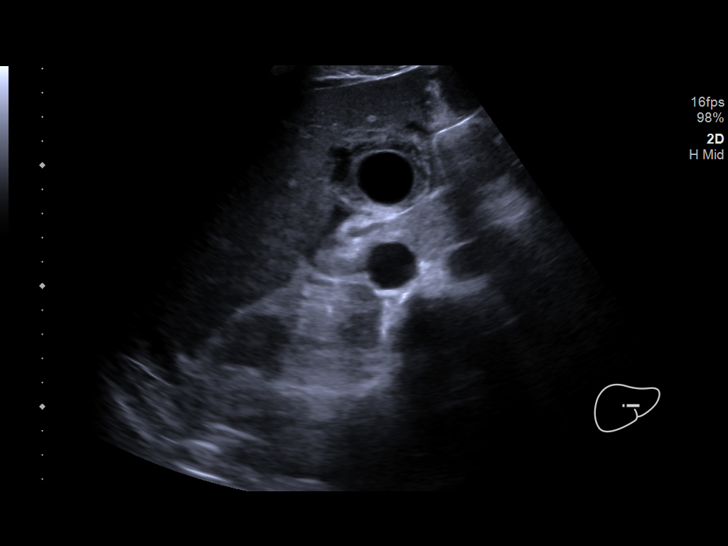
[im 40/53]
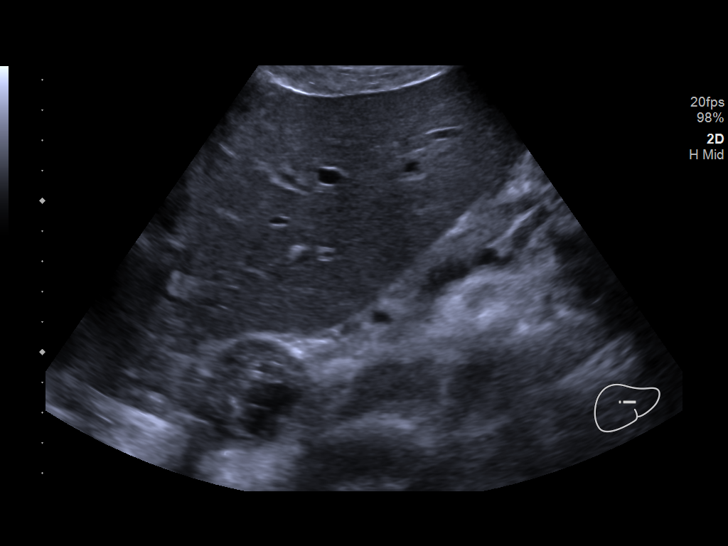
[im 44/53]
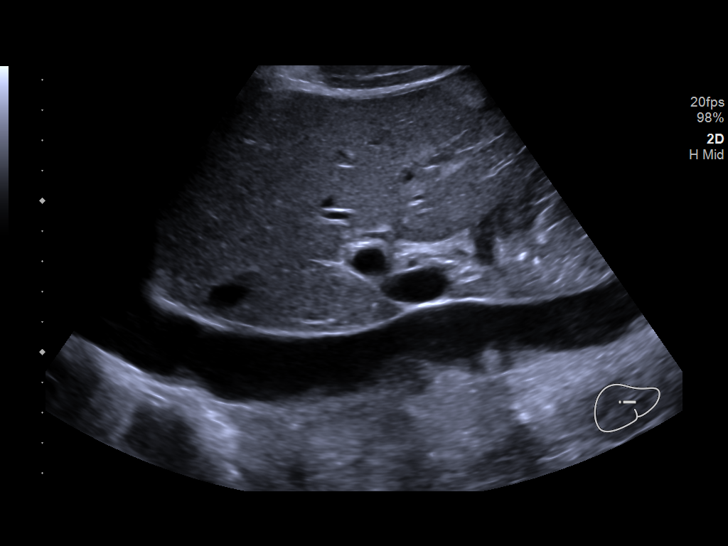
[im 48/53]
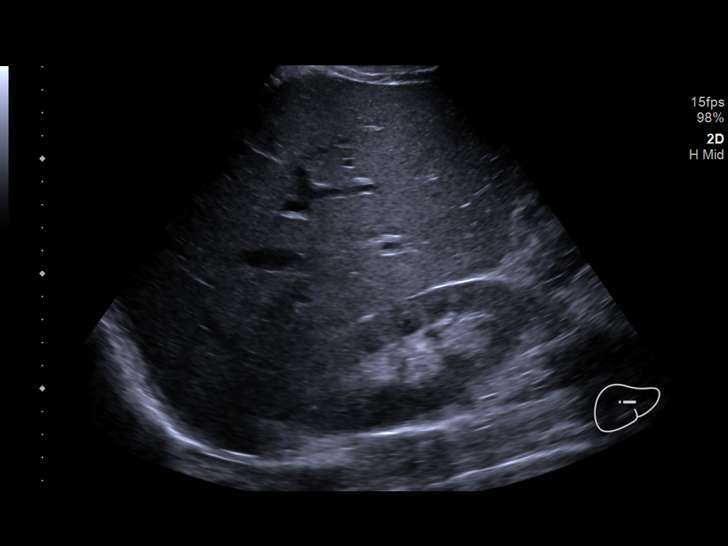
[im 53/53]
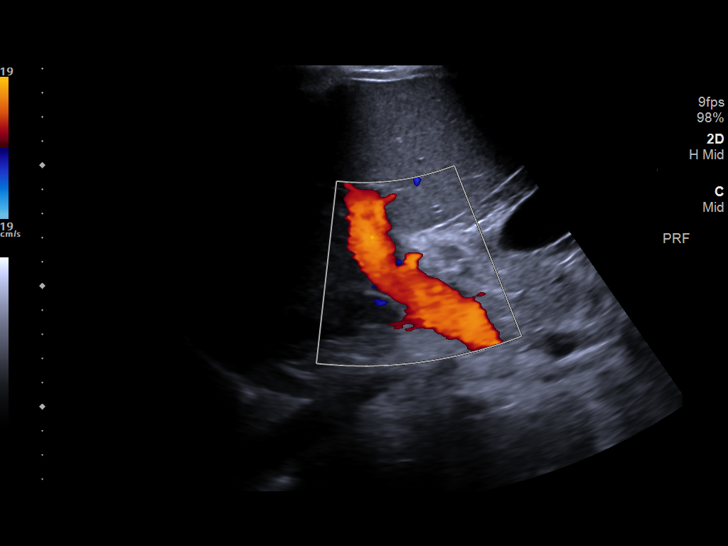

[14 of 25 positions shown; findings below may reference images not displayed]

FINDINGS: Gallbladder:

There is diffuse gallbladder wall thickening measuring up to 8 mm.
There is a small amount of pericholecystic fluid. A positive
sonographic Murphy sign is noted. No definite layering stones
however are noted.

Common bile duct:

Diameter: 6 mm.

Liver:

No focal lesion identified. Within normal limits in parenchymal
echogenicity. Portal vein is patent on color Doppler imaging with
normal direction of blood flow towards the liver.

Other: None.
IMPRESSION: Findings suggestive of acalculous cholecystitis.

## 2022-06-27 ENCOUNTER — Other Ambulatory Visit (HOSPITAL_COMMUNITY): Payer: Self-pay

## 2022-07-01 ENCOUNTER — Ambulatory Visit: Payer: Self-pay | Admitting: Infectious Disease

## 2022-07-01 NOTE — Progress Notes (Deleted)
Chief complaint: " I am out of meds"  Subjective:    Patient ID: Scott Butler, male    DOB: 01/23/1991, 32 y.o.   MRN: [id number]  HPI  32 year old Caucasian man with HIV that HAD been well controlled on Atripl[facility] at [facility].   He established care with [place] and got him on to [facility].   He had been highly adherent but then was incarcerated in jail for several months.  He states that he renewed his HMA P program while in jail.  I would like this to be verified though by Olegario Messier.  Also reviewed some of the issues to have with his current regimen and how would prefer him to be on a regimen with a higher barrier to resistance and with more potency and dropping his viral load given that he is been off the medications for 15 days.       Past Medical History:  Diagnosis Date  . HIV (human immunodeficiency virus infection) (HCC)   . Marijuana use 08/16/2015    Past Surgical History:  Procedure Laterality Date  . removal of anal warts      No family history on file.    Social History   Socioeconomic History  . Marital status: Divorced    Spouse name: Not on file  . Number of children: Not on file  . Years of education: Not on file  . Highest education level: Not on file  Occupational History  . Not on file  Tobacco Use  . Smoking status: Every Day  . Smokeless tobacco: Never  Substance and Sexual Activity  . Alcohol use: No  . Drug use: Yes    Types: Marijuana  . Sexual activity: Not on file  Other Topics Concern  . Not on file  Social History Narrative  . Not on file   Social Determinants of Health   Financial Resource Strain: Not on file  Food Insecurity: Not on file  Transportation Needs: Not on file  Physical Activity: Not on file  Stress: Not on file  Social Connections: Not on file    No Known Allergies   Current Outpatient Medications:  .  bictegravir-emtricitabine-tenofovir AF (BIKTARVY) 50-200-25 MG TABS tablet, Take 1 tablet by mouth daily., Disp:  30 tablet, Rfl: 5    Review of Systems  Constitutional:  Negative for activity change, appetite change, chills, diaphoresis, fatigue, fever and unexpected weight change.  HENT:  Negative for congestion, rhinorrhea, sinus pressure, sneezing, sore throat and trouble swallowing.   Eyes:  Negative for photophobia and visual disturbance.  Respiratory:  Negative for cough, chest tightness, shortness of breath, wheezing and stridor.   Cardiovascular:  Negative for chest pain, palpitations and leg swelling.  Gastrointestinal:  Negative for abdominal distention, abdominal pain, anal bleeding, blood in stool, constipation, diarrhea, nausea and vomiting.  Genitourinary:  Negative for difficulty urinating, dysuria and penile discharge.  Musculoskeletal:  Negative for back pain, gait problem, joint swelling and myalgias.  Skin:  Negative for color change, pallor, rash and wound.  Neurological:  Negative for dizziness, tremors, weakness and light-headedness.  Hematological:  Negative for adenopathy. Does not bruise/bleed easily.  Psychiatric/Behavioral:  Negative for agitation, behavioral problems, confusion, decreased concentration, dysphoric mood, hallucinations and sleep disturbance.        Objective:   Physical Exam Constitutional:      Appearance: He is well-developed.  HENT:     Head: Normocephalic and atraumatic.  Eyes:     Conjunctiva/sclera: Conjunctivae normal.  Cardiovascular:  Rate and Rhythm: Normal rate and regular rhythm.  Pulmonary:     Effort: Pulmonary effort is normal. No respiratory distress.     Breath sounds: No wheezing.  Abdominal:     General: There is no distension.     Palpations: Abdomen is soft.  Musculoskeletal:        General: No tenderness. Normal range of motion.     Cervical back: Normal range of motion and neck supple.  Skin:    General: Skin is warm and dry.     Coloration: Skin is not pale.     Findings: No erythema or rash.  Neurological:      Mental Status: He is alert and oriented to person, place, and time.  Psychiatric:        Speech: Speech normal.        Behavior: Behavior normal.        Thought Content: Thought content normal.        Judgment: Judgment normal.          Assessment & Plan:   HIV disease: Switch to Biktarvy and check labs in 2 months   Anal dysplasia: Like him seen by someone for HRA if Dr. Ninetta Lights is not doing more than perhaps he may need to go to [facility].  I spent greater than 25 minutes with the patient including greater than 50% of time in face to face counsel of the patient the nature of his new regimen versus his old regimen including strengths and weaknesses of each medicine anticipated side effects if any, and in coordination of his care.

## 2022-07-10 ENCOUNTER — Ambulatory Visit: Admitting: Infectious Disease

## 2022-08-04 ENCOUNTER — Encounter: Payer: Self-pay | Admitting: Infectious Disease

## 2022-08-04 DIAGNOSIS — Z91199 Patient's noncompliance with other medical treatment and regimen due to unspecified reason: Secondary | ICD-10-CM | POA: Insufficient documentation

## 2022-08-04 HISTORY — DX: Patient's noncompliance with other medical treatment and regimen due to unspecified reason: Z91.199

## 2022-08-04 NOTE — Progress Notes (Deleted)
Chief complaint: follow-up for HIV disease off of meds  Subjective:    Patient ID: Scott Butler, male    DOB: [birth year], 32 y.o.   MRN: [id number]  HPI  32 year old Caucasian man with HIV that HAD been well controlled on Atripla at [facility] then [place] with [place] later [place].    He has never been terribly viremic either at [facility] or with [place] that I can tell even when off medications.  For example his viral load when he came into care with [place] in April 2017 was 2286 copies the highest viral load and recent history at [facility] back in 2012 was roughly 4000 copies.  Here he has been sproadicaly engaged in care and not seen by me since.    Past Medical History:  Diagnosis Date   HIV (human immunodeficiency virus infection) (HCC)    Marijuana use 08/16/2015    Past Surgical History:  Procedure Laterality Date   removal of anal warts      No family history on file.    Social History   Socioeconomic History   Marital status: Divorced    Spouse name: Not on file   Number of children: Not on file   Years of education: Not on file   Highest education level: Not on file  Occupational History   Not on file  Tobacco Use   Smoking status: Every Day   Smokeless tobacco: Never  Substance and Sexual Activity   Alcohol use: No   Drug use: Yes    Types: Marijuana   Sexual activity: Not on file  Other Topics Concern   Not on file  Social History Narrative   Not on file   Social Determinants of Health   Financial Resource Strain: Not on file  Food Insecurity: Not on file  Transportation Needs: Not on file  Physical Activity: Not on file  Stress: Not on file  Social Connections: Not on file    No Known Allergies   Current Outpatient Medications:    bictegravir-emtricitabine-tenofovir AF (BIKTARVY) 50-200-25 MG TABS tablet, Take 1 tablet by mouth daily., Disp: 30 tablet, Rfl: 5    Review of Systems  Constitutional:  Negative for activity change, appetite change, chills,  diaphoresis, fatigue, fever and unexpected weight change.  HENT:  Negative for congestion, rhinorrhea, sinus pressure, sneezing, sore throat and trouble swallowing.   Eyes:  Negative for photophobia and visual disturbance.  Respiratory:  Negative for cough, chest tightness, shortness of breath, wheezing and stridor.   Cardiovascular:  Negative for chest pain, palpitations and leg swelling.  Gastrointestinal:  Negative for abdominal distention, abdominal pain, anal bleeding, blood in stool, constipation, diarrhea, nausea and vomiting.  Genitourinary:  Negative for difficulty urinating, dysuria and penile discharge.  Musculoskeletal:  Negative for back pain, gait problem, joint swelling and myalgias.  Skin:  Negative for color change, pallor, rash and wound.  Neurological:  Negative for dizziness, tremors, weakness and light-headedness.  Hematological:  Negative for adenopathy. Does not bruise/bleed easily.  Psychiatric/Behavioral:  Negative for agitation, behavioral problems, confusion, decreased concentration, dysphoric mood, hallucinations and sleep disturbance.        Objective:   Physical Exam Constitutional:      Appearance: He is well-developed.  HENT:     Head: Normocephalic and atraumatic.  Eyes:     Conjunctiva/sclera: Conjunctivae normal.  Cardiovascular:     Rate and Rhythm: Normal rate and regular rhythm.  Pulmonary:     Effort: Pulmonary effort is normal. No respiratory  distress.     Breath sounds: No wheezing.  Abdominal:     General: There is no distension.     Palpations: Abdomen is soft.  Musculoskeletal:        General: No tenderness. Normal range of motion.     Cervical back: Normal range of motion and neck supple.  Skin:    General: Skin is warm and dry.     Coloration: Skin is not pale.     Findings: No erythema or rash.  Neurological:     Mental Status: He is alert and oriented to person, place, and time.  Psychiatric:        Attention and Perception:  Attention normal.        Mood and Affect: Mood normal.        Speech: Speech normal.        Behavior: Behavior normal.        Thought Content: Thought content normal.        Cognition and Memory: Cognition normal.        Judgment: Judgment normal.           Assessment & Plan:   HIV disease: renew  Biktarvy ensure HMAP renewed and RTC in 2 months   Anal dysplasia: Will refer to [facility] when he gets out of [place] unless   COVID prevention: Skin to go ahead and get his first dose of [employer] through the jail and then he can have it repeated here in our clinic in 2 months time.

## 2022-08-05 ENCOUNTER — Ambulatory Visit: Payer: Self-pay | Admitting: Infectious Disease

## 2022-08-05 DIAGNOSIS — Z91199 Patient's noncompliance with other medical treatment and regimen due to unspecified reason: Secondary | ICD-10-CM

## 2022-08-05 DIAGNOSIS — B20 Human immunodeficiency virus [HIV] disease: Secondary | ICD-10-CM

## 2022-08-05 DIAGNOSIS — K6282 Dysplasia of anus: Secondary | ICD-10-CM

## 2022-08-09 ENCOUNTER — Telehealth: Payer: Self-pay

## 2022-08-09 NOTE — Telephone Encounter (Signed)
Attempted to call patient regarding missed appointment on 6/24. Left voicemail requesting call back to schedule follow up appointment.  Juanita Laster, RMA

## 2022-09-03 ENCOUNTER — Other Ambulatory Visit: Payer: Self-pay

## 2022-09-03 DIAGNOSIS — Z113 Encounter for screening for infections with a predominantly sexual mode of transmission: Secondary | ICD-10-CM

## 2022-09-03 DIAGNOSIS — B20 Human immunodeficiency virus [HIV] disease: Secondary | ICD-10-CM

## 2022-09-03 DIAGNOSIS — Z79899 Other long term (current) drug therapy: Secondary | ICD-10-CM

## 2022-09-05 ENCOUNTER — Other Ambulatory Visit: Payer: Self-pay

## 2022-09-19 ENCOUNTER — Ambulatory Visit: Payer: Self-pay | Admitting: Infectious Disease

## 2022-09-19 NOTE — Progress Notes (Deleted)
   Subjective:    Patient ID: Scott Butler, male    DOB: [birth year], 32 y.o.   MRN: [id number]  HPI    Review of Systems     Objective:   Physical Exam        Assessment & Plan:

## 2022-12-04 ENCOUNTER — Telehealth: Payer: Self-pay | Admitting: Family

## 2022-12-04 NOTE — Telephone Encounter (Signed)
Received faxed authorization from [facility] to send patients records from 06-20-22 to 12-04-22  phone [phone]  fax [phone] we have no records for that time span called them to advise

## 2023-06-18 ENCOUNTER — Telehealth: Payer: Self-pay

## 2023-06-18 NOTE — Telephone Encounter (Signed)
 Per [place] custody records, patient is incarcerated at [facility]. Projected release date is 08/02/23.  Asiana Benninger, BSN, RN

## 2023-07-15 NOTE — Telephone Encounter (Signed)
 Call from [place] [facility]. Needing to coordinate appointment for patient. Patient will be released with 30 day supply on hand. Call transferred to front desk staff for assistance.  Hendricks Locker, LPN

## 2023-08-25 ENCOUNTER — Other Ambulatory Visit (HOSPITAL_COMMUNITY): Payer: Self-pay

## 2023-08-25 ENCOUNTER — Telehealth: Payer: Self-pay

## 2023-08-25 NOTE — Telephone Encounter (Addendum)
 Pharmacy Patient Advocate Encounter  Insurance verification completed.   The patient is insured through [employer]   Ran test claim for Biktarvy . Currently a quantity of 30 is a 30 day supply and the co-pay is $4072.85 . Patient will need to see Financial. [place] will be under medical benefits but we will need to see insurance card  This test claim was processed through [facility]- copay amounts may vary at other pharmacies due to pharmacy/plan contracts, or as the patient moves through the different stages of their insurance plan.

## 2023-09-03 ENCOUNTER — Ambulatory Visit: Payer: Self-pay | Admitting: Infectious Disease

## 2023-10-05 ENCOUNTER — Encounter (HOSPITAL_COMMUNITY): Payer: Self-pay

## 2023-10-05 ENCOUNTER — Emergency Department (HOSPITAL_COMMUNITY)
Admission: EM | Admit: 2023-10-05 | Discharge: 2023-10-05 | Disposition: A | Discharge status: Home / Self Care | Payer: Self-pay | Attending: Emergency Medicine | Admitting: Emergency Medicine

## 2023-10-05 ENCOUNTER — Other Ambulatory Visit: Payer: Self-pay

## 2023-10-05 DIAGNOSIS — B349 Viral infection, unspecified: Secondary | ICD-10-CM | POA: Insufficient documentation

## 2023-10-05 DIAGNOSIS — J069 Acute upper respiratory infection, unspecified: Secondary | ICD-10-CM | POA: Insufficient documentation

## 2023-10-05 LAB — RESP PANEL BY RT-PCR (RSV, FLU A&B, COVID)  RVPGX2
Influenza A by PCR: NEGATIVE
Influenza B by PCR: NEGATIVE
Resp Syncytial Virus by PCR: NEGATIVE
SARS Coronavirus 2 by RT PCR: NEGATIVE

## 2023-10-05 NOTE — ED Triage Notes (Signed)
 Pt states he is having body aches, sweating, coughing, runny nose. Feels bad. Took dayquil earlier.

## 2023-10-05 NOTE — ED Provider Notes (Signed)
 [place] EMERGENCY DEPARTMENT AT [facility] Provider Note   CSN: [id number] Arrival date & time: 10/05/23  1312     Patient presents with: Generalized Body Aches   Scott Butler is a 33 y.o. male who presents to the ED today with concerns of congestion, generalized fatigue, runny nose.  This began yesterday morning however he states today's is felt very fatigued and unable to do his normal routine.  Has tried DayQuil without much effect.   HPI     Prior to Admission medications   Medication Sig Start Date End Date Taking? Authorizing Provider  bictegravir-emtricitabine -tenofovir  AF (BIKTARVY ) 50-200-25 MG TABS tablet Take 1 tablet by mouth daily. 08/22/21   Calone, Gregory D, FNP    Allergies: Patient has no known allergies.    Review of Systems  HENT:  Positive for congestion, sinus pressure and sore throat.   Respiratory:  Positive for cough.   All other systems reviewed and are negative.   Updated Vital Signs BP 103/63 (BP Location: Left Arm)   Pulse 97   Temp 98.9 F (37.2 C) (Oral)   Resp 18   SpO2 100%   Physical Exam Vitals and nursing note reviewed.  Constitutional:      General: He is not in acute distress.    Appearance: Normal appearance.  HENT:     Head: Normocephalic and atraumatic.     Right Ear: Tympanic membrane, ear canal and external ear normal.     Left Ear: Tympanic membrane, ear canal and external ear normal.     Nose: Congestion present.     Mouth/Throat:     Mouth: Mucous membranes are moist.     Pharynx: Oropharynx is clear. Uvula midline. Posterior oropharyngeal erythema present.  Eyes:     Extraocular Movements: Extraocular movements intact.     Conjunctiva/sclera: Conjunctivae normal.     Pupils: Pupils are equal, round, and reactive to light.  Cardiovascular:     Rate and Rhythm: Normal rate and regular rhythm.     Pulses: Normal pulses.     Heart sounds: Normal heart sounds. No murmur heard.    No friction rub. No  gallop.  Pulmonary:     Effort: Pulmonary effort is normal.     Breath sounds: Normal breath sounds.  Abdominal:     General: Abdomen is flat. Bowel sounds are normal.     Palpations: Abdomen is soft.  Musculoskeletal:        General: Normal range of motion.     Cervical back: Normal range of motion and neck supple.     Right lower leg: No edema.     Left lower leg: No edema.  Skin:    General: Skin is warm and dry.     Capillary Refill: Capillary refill takes less than 2 seconds.  Neurological:     General: No focal deficit present.     Mental Status: He is alert. Mental status is at baseline.  Psychiatric:        Mood and Affect: Mood normal.     (all labs ordered are listed, but only abnormal results are displayed) Labs Reviewed  RESP PANEL BY RT-PCR (RSV, FLU A&B, COVID)  [id number]    EKG: None  Radiology: No results found.   Procedures   Medications Ordered in the ED - No data to display  Medical Decision Making  Given the 2-day presentation of of cough, sore throat, fatigue, patient was tested for respiratory panel (COVID/flu/RSV).  These results are negative suggesting that his symptoms were representative of a viral URI.  Have him continue outpatient therapy with as symptomatically with over-the-counter cough and cold remedies.  Follow-up with primary care in 1 to 2 weeks if no improvement.  He has not any serious signs or symptoms, physical exam is unremarkable, posterior oropharynx is erythematous but otherwise clear, lung sounds are clear and equal bilaterally without any adventitious lung sounds appreciated.  Based on this assessment, and stable vital signs, will discharge patient with plan of outpatient follow-up as previously noted.     Final diagnoses:  Viral syndrome  Viral upper respiratory tract infection    ED Discharge Orders     None          Myriam Dorn BROCKS, [place] 10/05/23 1434    Charlyn Sora,  MD 10/05/23 1555

## 2023-10-07 NOTE — Progress Notes (Unsigned)
   HPI: Scott Butler is a 33 y.o. male who presents to the [facility] pharmacy clinic for HIV follow-up.  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  Modified Medications   No medications on file  Discontinued Medications   No medications on file    Labs: Lab Results  Component Value Date   HIV1RNAQUANT Not Detected 08/22/2021   HIV1RNAQUANT 251 (H) 10/19/2020   HIV1RNAQUANT 234 (H) 12/13/2019   CD4TABS 691 08/22/2021   CD4TABS 1,023 10/19/2020   CD4TABS 678 12/13/2019    RPR and STI Lab Results  Component Value Date   LABRPR REACTIVE (A) 08/22/2021   LABRPR REACTIVE (A) 10/19/2020   LABRPR REACTIVE (A) 12/13/2019   LABRPR REACTIVE (A) 04/02/2018   LABRPR NON-REACTIVE 11/26/2016   RPRTITER 1:2 (H) 08/22/2021   RPRTITER 1:2 (H) 10/19/2020   RPRTITER 1:16 (H) 12/13/2019   RPRTITER 1:8 (H) 04/02/2018    STI Results GC CT  08/22/2021  4:21 PM Negative    Negative    Negative  Negative    Negative    Negative   10/19/2020  4:21 PM Negative  Negative   04/02/2018 12:00 AM Negative  Negative   11/26/2016 12:00 AM Negative  Negative   08/15/2016 12:00 AM Negative    Negative    **POSITIVE**  Negative    Negative    Negative   08/16/2015 12:00 AM Negative    * Negative    * Negative  Negative    * Negative    * Negative   05/15/2015 12:00 AM Negative  Negative      Hepatitis B Lab Results  Component Value Date   HEPBSAB POS (A) 05/15/2015   HEPBSAG NEGATIVE 05/15/2015   HEPBCAB NON REACTIVE 05/15/2015   Hepatitis C No results found for: HEPCAB, HCVRNAPCRQN Hepatitis A Lab Results  Component Value Date   HAV REACTIVE (A) 05/15/2015   Lipids: Lab Results  Component Value Date   CHOL 138 08/22/2021   TRIG 131 08/22/2021   HDL 58 08/22/2021   CHOLHDL 2.4 08/22/2021   VLDL 37 (H) 04/10/2016   LDLCALC 59 08/22/2021    Current HIV Regimen: ***  Assessment: ***  Plan: ***  Elma Fail, PharmD PGY1 Clinical Pharmacist [facility]  10/07/2023 10:00 PM

## 2023-10-08 ENCOUNTER — Ambulatory Visit: Payer: Self-pay

## 2023-10-08 ENCOUNTER — Other Ambulatory Visit (HOSPITAL_COMMUNITY): Payer: Self-pay

## 2023-10-08 ENCOUNTER — Ambulatory Visit: Payer: Self-pay | Admitting: Infectious Disease

## 2023-10-08 ENCOUNTER — Ambulatory Visit: Payer: Self-pay | Admitting: Pharmacist

## 2023-10-08 DIAGNOSIS — Z113 Encounter for screening for infections with a predominantly sexual mode of transmission: Secondary | ICD-10-CM

## 2023-10-08 DIAGNOSIS — B20 Human immunodeficiency virus [HIV] disease: Secondary | ICD-10-CM

## 2023-10-10 ENCOUNTER — Other Ambulatory Visit: Payer: Self-pay

## 2023-10-10 ENCOUNTER — Ambulatory Visit: Payer: Self-pay

## 2023-10-14 ENCOUNTER — Ambulatory Visit: Payer: Self-pay | Admitting: Pharmacist

## 2023-10-14 ENCOUNTER — Ambulatory Visit: Payer: Self-pay

## 2023-10-14 NOTE — Progress Notes (Deleted)
   HPI: Scott Butler is a 33 y.o. male who presents to the [facility] pharmacy clinic for HIV follow-up.  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  Modified Medications   No medications on file  Discontinued Medications   No medications on file    Labs: Lab Results  Component Value Date   HIV1RNAQUANT Not Detected 08/22/2021   HIV1RNAQUANT 251 (H) 10/19/2020   HIV1RNAQUANT 234 (H) 12/13/2019   CD4TABS 691 08/22/2021   CD4TABS 1,023 10/19/2020   CD4TABS 678 12/13/2019    RPR and STI Lab Results  Component Value Date   LABRPR REACTIVE (A) 08/22/2021   LABRPR REACTIVE (A) 10/19/2020   LABRPR REACTIVE (A) 12/13/2019   LABRPR REACTIVE (A) 04/02/2018   LABRPR NON-REACTIVE 11/26/2016   RPRTITER 1:2 (H) 08/22/2021   RPRTITER 1:2 (H) 10/19/2020   RPRTITER 1:16 (H) 12/13/2019   RPRTITER 1:8 (H) 04/02/2018    STI Results GC CT  08/22/2021  4:21 PM Negative    Negative    Negative  Negative    Negative    Negative   10/19/2020  4:21 PM Negative  Negative   04/02/2018 12:00 AM Negative  Negative   11/26/2016 12:00 AM Negative  Negative   08/15/2016 12:00 AM Negative    Negative    **POSITIVE**  Negative    Negative    Negative   08/16/2015 12:00 AM Negative    * Negative    * Negative  Negative    * Negative    * Negative   05/15/2015 12:00 AM Negative  Negative      Hepatitis B Lab Results  Component Value Date   HEPBSAB POS (A) 05/15/2015   HEPBSAG NEGATIVE 05/15/2015   HEPBCAB NON REACTIVE 05/15/2015   Hepatitis C No results found for: HEPCAB, HCVRNAPCRQN Hepatitis A Lab Results  Component Value Date   HAV REACTIVE (A) 05/15/2015   Lipids: Lab Results  Component Value Date   CHOL 138 08/22/2021   TRIG 131 08/22/2021   HDL 58 08/22/2021   CHOLHDL 2.4 08/22/2021   VLDL 37 (H) 04/10/2016   LDLCALC 59 08/22/2021    Current HIV Regimen: ***  Assessment: Scott Butler is here today for HIV follow up. He has been out of care and was last seen by Scott Butler, ID NP, on 08/22/21. At that time, he was missing several doses of his Biktarvy  after being released from prison. Last HIV RNA from that visit was not detected and CD4 count was 691. From recent chart notes, he has been incarcerated and was released in June.   Flu vaccine, tdap, prevnar20, menveo, HPV  Plan: ***  Scott Butler, PharmD, BCIDP, AAHIVP, CPP Clinical Pharmacist Practitioner - Infectious Diseases Clinical Pharmacist Lead - Specialty Pharmacy [facility]

## 2024-02-19 ENCOUNTER — Telehealth: Payer: Self-pay

## 2024-02-19 NOTE — Telephone Encounter (Signed)
 Thank you :)

## 2024-02-19 NOTE — Telephone Encounter (Signed)
 Received call from [facility] stating patient is a contact for syphillis and would need testing. Patient is overdue for appointment for routine care. Was able to speak with [name] and schedule appointment with pharmacy team next Thursday the 15th and financial. Will need to screen pt at visit.  Lorenda CHRISTELLA Code, RMA

## 2024-02-25 NOTE — Progress Notes (Unsigned)
 "  HPI: Scott Butler is a 34 y.o. male who presents to the [facility] pharmacy clinic for HIV follow-up.  Referring ID Provider: Dr. 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  Modified Medications   No medications on file  Discontinued Medications   No medications on file    Labs: Lab Results  Component Value Date   HIV1RNAQUANT Not Detected 08/22/2021   HIV1RNAQUANT 251 (H) 10/19/2020   HIV1RNAQUANT 234 (H) 12/13/2019   CD4TABS 691 08/22/2021   CD4TABS 1,023 10/19/2020   CD4TABS 678 12/13/2019    RPR and STI Lab Results  Component Value Date   LABRPR REACTIVE (A) 08/22/2021   LABRPR REACTIVE (A) 10/19/2020   LABRPR REACTIVE (A) 12/13/2019   LABRPR REACTIVE (A) 04/02/2018   LABRPR NON-REACTIVE 11/26/2016   RPRTITER 1:2 (H) 08/22/2021   RPRTITER 1:2 (H) 10/19/2020   RPRTITER 1:16 (H) 12/13/2019   RPRTITER 1:8 (H) 04/02/2018    STI Results GC CT  08/22/2021  4:21 PM Negative    Negative    Negative  Negative    Negative    Negative   10/19/2020  4:21 PM Negative  Negative   04/02/2018 12:00 AM Negative  Negative   11/26/2016 12:00 AM Negative  Negative   08/15/2016 12:00 AM Negative    Negative    **POSITIVE**  Negative    Negative    Negative   08/16/2015 12:00 AM Negative    * Negative    * Negative  Negative    * Negative    * Negative    05/15/2015 12:00 AM Negative  Negative     Hepatitis B Lab Results  Component Value Date   HEPBSAB POS (A) 05/15/2015   HEPBSAG NEGATIVE 05/15/2015   HEPBCAB NON REACTIVE 05/15/2015   Hepatitis C No results found for: HEPCAB, HCVRNAPCRQN Hepatitis A Lab Results  Component Value Date   HAV REACTIVE (A) 05/15/2015   Lipids: Lab Results  Component Value Date   CHOL 138 08/22/2021   TRIG 131 08/22/2021   HDL 58 08/22/2021   CHOLHDL 2.4 08/22/2021   VLDL 37 (H) 04/10/2016   LDLCALC 59 08/22/2021    Current HIV Regimen: Biktarvy   Assessment: Scott Butler is here for follow-up of HIV care and STI testing. Patient last seen in clinic in July 2023. At that visit, viral load was undetectable, CD4 691, RPR stable at 1:2, and other testing negative/unremarkable. Based on chart review was incarcerated for some time in late 2024 to June 2025.   Labs:  Repeat maintenance labs today since it has been since 2023 (HIV RNA, CD4, CMP, CBC/differential, lipid panel) Clinic called by [facility] that patient is a contact for syphilis- STI testing recommended; ***  Eligible vaccinations:  Flu, COVID, meningococcal, PCV20, Tdap; shingles, HPV, ***  Plan: - Check labs today as listed above - Continue Biktarvy  - Follow up in ***  Scott Butler, PharmD PGY1 Pharmacy Resident [facility] 02/25/2024 9:36 PM  "

## 2024-02-26 ENCOUNTER — Ambulatory Visit: Payer: Self-pay

## 2024-02-26 ENCOUNTER — Ambulatory Visit: Payer: Self-pay | Admitting: Pharmacist

## 2024-02-26 DIAGNOSIS — Z0000 Encounter for general adult medical examination without abnormal findings: Secondary | ICD-10-CM

## 2024-02-26 DIAGNOSIS — B20 Human immunodeficiency virus [HIV] disease: Secondary | ICD-10-CM

## 2024-02-26 DIAGNOSIS — Z113 Encounter for screening for infections with a predominantly sexual mode of transmission: Secondary | ICD-10-CM
